# Patient Record
Sex: Male | Born: 1950 | Race: Black or African American | Hispanic: No | Marital: Single | Smoking: Never smoker
Health system: Southern US, Community
[De-identification: ages and names within clinical notes are randomized; demographics above are authoritative.]

## PROBLEM LIST (undated history)

## (undated) DIAGNOSIS — K219 Gastro-esophageal reflux disease without esophagitis: Secondary | ICD-10-CM

## (undated) DIAGNOSIS — I10 Essential (primary) hypertension: Secondary | ICD-10-CM

## (undated) DIAGNOSIS — Z87828 Personal history of other (healed) physical injury and trauma: Secondary | ICD-10-CM

## (undated) DIAGNOSIS — C61 Malignant neoplasm of prostate: Secondary | ICD-10-CM

## (undated) DIAGNOSIS — H9193 Unspecified hearing loss, bilateral: Secondary | ICD-10-CM

## (undated) DIAGNOSIS — Z973 Presence of spectacles and contact lenses: Secondary | ICD-10-CM

## (undated) DIAGNOSIS — Z8669 Personal history of other diseases of the nervous system and sense organs: Secondary | ICD-10-CM

## (undated) DIAGNOSIS — Z794 Long term (current) use of insulin: Secondary | ICD-10-CM

## (undated) DIAGNOSIS — N529 Male erectile dysfunction, unspecified: Secondary | ICD-10-CM

## (undated) DIAGNOSIS — E291 Testicular hypofunction: Secondary | ICD-10-CM

## (undated) DIAGNOSIS — M1990 Unspecified osteoarthritis, unspecified site: Secondary | ICD-10-CM

## (undated) DIAGNOSIS — Z531 Procedure and treatment not carried out because of patient's decision for reasons of belief and group pressure: Secondary | ICD-10-CM

## (undated) DIAGNOSIS — IMO0001 Reserved for inherently not codable concepts without codable children: Secondary | ICD-10-CM

## (undated) DIAGNOSIS — G629 Polyneuropathy, unspecified: Secondary | ICD-10-CM

## (undated) DIAGNOSIS — M25511 Pain in right shoulder: Secondary | ICD-10-CM

## (undated) DIAGNOSIS — N400 Enlarged prostate without lower urinary tract symptoms: Secondary | ICD-10-CM

## (undated) DIAGNOSIS — E119 Type 2 diabetes mellitus without complications: Secondary | ICD-10-CM

## (undated) DIAGNOSIS — E785 Hyperlipidemia, unspecified: Secondary | ICD-10-CM

## (undated) HISTORY — PX: OTHER SURGICAL HISTORY: SHX169

## (undated) HISTORY — DX: Malignant neoplasm of prostate: C61

## (undated) HISTORY — DX: Testicular hypofunction: E29.1

## (undated) HISTORY — PX: PROSTATE BIOPSY: SHX241

## (undated) HISTORY — DX: Essential (primary) hypertension: I10

## (undated) HISTORY — DX: Unspecified osteoarthritis, unspecified site: M19.90

## (undated) HISTORY — PX: TYMPANIC MEMBRANE REPAIR: SHX294

## (undated) HISTORY — DX: Male erectile dysfunction, unspecified: N52.9

---

## 1997-09-10 DIAGNOSIS — E119 Type 2 diabetes mellitus without complications: Secondary | ICD-10-CM

## 1997-09-10 HISTORY — DX: Type 2 diabetes mellitus without complications: E11.9

## 2003-09-11 DIAGNOSIS — Z8679 Personal history of other diseases of the circulatory system: Secondary | ICD-10-CM | POA: Insufficient documentation

## 2005-03-12 ENCOUNTER — Ambulatory Visit: Payer: Self-pay | Admitting: Family Medicine

## 2005-03-12 ENCOUNTER — Encounter (INDEPENDENT_AMBULATORY_CARE_PROVIDER_SITE_OTHER): Payer: Self-pay | Admitting: Internal Medicine

## 2005-03-12 LAB — CONVERTED CEMR LAB
HCT: 33.5 %
MCV: 60.3 fL
Platelets: 234 10*3/uL

## 2005-03-16 ENCOUNTER — Ambulatory Visit: Payer: Self-pay | Admitting: *Deleted

## 2005-04-09 ENCOUNTER — Ambulatory Visit: Payer: Self-pay | Admitting: Family Medicine

## 2005-05-11 ENCOUNTER — Ambulatory Visit: Payer: Self-pay | Admitting: Family Medicine

## 2005-08-22 ENCOUNTER — Emergency Department (HOSPITAL_COMMUNITY): Admission: EM | Admit: 2005-08-22 | Discharge: 2005-08-22 | Payer: Self-pay | Admitting: Emergency Medicine

## 2005-10-15 ENCOUNTER — Ambulatory Visit: Payer: Self-pay | Admitting: Internal Medicine

## 2005-10-16 ENCOUNTER — Encounter (INDEPENDENT_AMBULATORY_CARE_PROVIDER_SITE_OTHER): Payer: Self-pay | Admitting: Internal Medicine

## 2005-10-16 LAB — CONVERTED CEMR LAB: Microalb, Ur: 0.71 mg/dL

## 2005-10-29 ENCOUNTER — Ambulatory Visit: Payer: Self-pay | Admitting: Internal Medicine

## 2006-04-05 ENCOUNTER — Encounter (INDEPENDENT_AMBULATORY_CARE_PROVIDER_SITE_OTHER): Payer: Self-pay | Admitting: Internal Medicine

## 2006-04-05 ENCOUNTER — Ambulatory Visit: Payer: Self-pay | Admitting: Internal Medicine

## 2006-04-05 LAB — CONVERTED CEMR LAB
Cholesterol: 163 mg/dL
Triglycerides: 81 mg/dL

## 2006-08-30 ENCOUNTER — Ambulatory Visit: Payer: Self-pay | Admitting: Internal Medicine

## 2007-01-30 ENCOUNTER — Ambulatory Visit: Payer: Self-pay | Admitting: Internal Medicine

## 2007-04-11 ENCOUNTER — Ambulatory Visit: Payer: Self-pay | Admitting: Internal Medicine

## 2007-04-11 LAB — CONVERTED CEMR LAB
Glucose, Bld: 504 mg/dL
Hgb A1c MFr Bld: 11.2 %

## 2007-04-17 ENCOUNTER — Ambulatory Visit: Payer: Self-pay | Admitting: Internal Medicine

## 2007-04-17 DIAGNOSIS — Z794 Long term (current) use of insulin: Secondary | ICD-10-CM

## 2007-04-17 DIAGNOSIS — M545 Low back pain, unspecified: Secondary | ICD-10-CM | POA: Insufficient documentation

## 2007-04-17 DIAGNOSIS — E782 Mixed hyperlipidemia: Secondary | ICD-10-CM | POA: Insufficient documentation

## 2007-04-17 DIAGNOSIS — F329 Major depressive disorder, single episode, unspecified: Secondary | ICD-10-CM | POA: Insufficient documentation

## 2007-04-17 DIAGNOSIS — I10 Essential (primary) hypertension: Secondary | ICD-10-CM | POA: Insufficient documentation

## 2007-04-17 DIAGNOSIS — E119 Type 2 diabetes mellitus without complications: Secondary | ICD-10-CM | POA: Insufficient documentation

## 2007-04-17 DIAGNOSIS — K5900 Constipation, unspecified: Secondary | ICD-10-CM | POA: Insufficient documentation

## 2007-04-17 DIAGNOSIS — Z9119 Patient's noncompliance with other medical treatment and regimen: Secondary | ICD-10-CM

## 2007-04-17 DIAGNOSIS — E1140 Type 2 diabetes mellitus with diabetic neuropathy, unspecified: Secondary | ICD-10-CM | POA: Insufficient documentation

## 2007-04-17 DIAGNOSIS — D509 Iron deficiency anemia, unspecified: Secondary | ICD-10-CM | POA: Insufficient documentation

## 2007-04-17 LAB — CONVERTED CEMR LAB
ALT: 21 units/L (ref 0–53)
Alkaline Phosphatase: 63 units/L (ref 39–117)
Cholesterol: 174 mg/dL (ref 0–200)
Indirect Bilirubin: 0.4 mg/dL (ref 0.0–0.9)
Total Protein: 7.3 g/dL (ref 6.0–8.3)
Triglycerides: 172 mg/dL — ABNORMAL HIGH (ref <150)

## 2007-04-23 ENCOUNTER — Encounter (INDEPENDENT_AMBULATORY_CARE_PROVIDER_SITE_OTHER): Payer: Self-pay | Admitting: Internal Medicine

## 2007-04-23 LAB — CONVERTED CEMR LAB
AST: 21 units/L (ref 0–37)
Alkaline Phosphatase: 62 units/L (ref 39–117)
BUN: 18 mg/dL (ref 6–23)
Calcium: 9.4 mg/dL (ref 8.4–10.5)
Creatinine, Ser: 1.29 mg/dL (ref 0.40–1.50)
Glucose, Bld: 274 mg/dL — ABNORMAL HIGH (ref 70–99)

## 2007-05-20 ENCOUNTER — Ambulatory Visit: Payer: Self-pay | Admitting: Internal Medicine

## 2007-05-20 DIAGNOSIS — G4700 Insomnia, unspecified: Secondary | ICD-10-CM

## 2007-05-20 DIAGNOSIS — R413 Other amnesia: Secondary | ICD-10-CM

## 2007-08-13 ENCOUNTER — Encounter (INDEPENDENT_AMBULATORY_CARE_PROVIDER_SITE_OTHER): Payer: Self-pay | Admitting: Internal Medicine

## 2007-08-19 ENCOUNTER — Ambulatory Visit: Payer: Self-pay | Admitting: Internal Medicine

## 2007-08-19 ENCOUNTER — Encounter (INDEPENDENT_AMBULATORY_CARE_PROVIDER_SITE_OTHER): Payer: Self-pay | Admitting: Nurse Practitioner

## 2007-08-19 LAB — CONVERTED CEMR LAB: Blood Glucose, Fingerstick: 600

## 2007-08-21 ENCOUNTER — Telehealth (INDEPENDENT_AMBULATORY_CARE_PROVIDER_SITE_OTHER): Payer: Self-pay | Admitting: Internal Medicine

## 2007-08-26 ENCOUNTER — Ambulatory Visit: Payer: Self-pay | Admitting: Internal Medicine

## 2007-08-26 LAB — CONVERTED CEMR LAB: Blood Glucose, Fingerstick: 384

## 2007-09-02 ENCOUNTER — Ambulatory Visit: Payer: Self-pay | Admitting: Internal Medicine

## 2007-10-03 ENCOUNTER — Ambulatory Visit: Payer: Self-pay | Admitting: Internal Medicine

## 2007-10-03 LAB — CONVERTED CEMR LAB: Blood Glucose, Fingerstick: 95

## 2007-10-06 ENCOUNTER — Telehealth (INDEPENDENT_AMBULATORY_CARE_PROVIDER_SITE_OTHER): Payer: Self-pay | Admitting: Internal Medicine

## 2007-12-09 ENCOUNTER — Ambulatory Visit: Payer: Self-pay | Admitting: Internal Medicine

## 2007-12-09 LAB — CONVERTED CEMR LAB
Blood Glucose, Fingerstick: 129
Hgb A1c MFr Bld: 12.7 %

## 2008-01-02 ENCOUNTER — Emergency Department (HOSPITAL_COMMUNITY): Admission: EM | Admit: 2008-01-02 | Discharge: 2008-01-03 | Payer: Self-pay | Admitting: Emergency Medicine

## 2008-02-06 ENCOUNTER — Ambulatory Visit: Payer: Self-pay | Admitting: Internal Medicine

## 2008-02-06 LAB — CONVERTED CEMR LAB
Bilirubin Urine: NEGATIVE
Blood in Urine, dipstick: NEGATIVE
Glucose, Urine, Semiquant: NEGATIVE
Specific Gravity, Urine: 1.01
WBC Urine, dipstick: NEGATIVE
pH: 6

## 2008-02-08 ENCOUNTER — Encounter (INDEPENDENT_AMBULATORY_CARE_PROVIDER_SITE_OTHER): Payer: Self-pay | Admitting: Internal Medicine

## 2008-02-08 LAB — CONVERTED CEMR LAB
ALT: 17 units/L (ref 0–53)
AST: 20 units/L (ref 0–37)
Albumin: 4.5 g/dL (ref 3.5–5.2)
Basophils Absolute: 0 10*3/uL (ref 0.0–0.1)
Basophils Relative: 0 % (ref 0–1)
Calcium: 9.3 mg/dL (ref 8.4–10.5)
Chloride: 104 meq/L (ref 96–112)
MCHC: 30.5 g/dL (ref 30.0–36.0)
Microalb, Ur: 0.5 mg/dL (ref 0.00–1.89)
Neutro Abs: 3.1 10*3/uL (ref 1.7–7.7)
Neutrophils Relative %: 43 % (ref 43–77)
PSA: 3.27 ng/mL (ref 0.10–4.00)
Potassium: 5 meq/L (ref 3.5–5.3)
RBC: 5.61 M/uL (ref 4.22–5.81)
RDW: 18.2 % — ABNORMAL HIGH (ref 11.5–15.5)
Total CHOL/HDL Ratio: 3.5

## 2008-02-09 ENCOUNTER — Encounter (INDEPENDENT_AMBULATORY_CARE_PROVIDER_SITE_OTHER): Payer: Self-pay | Admitting: Internal Medicine

## 2008-02-09 LAB — CONVERTED CEMR LAB
Hgb A2 Quant: 5.6 % — ABNORMAL HIGH (ref 2.2–3.2)
Hgb F Quant: 3.3 % — ABNORMAL HIGH (ref 0.0–2.0)

## 2008-04-02 ENCOUNTER — Ambulatory Visit: Payer: Self-pay | Admitting: Internal Medicine

## 2008-04-03 ENCOUNTER — Encounter (INDEPENDENT_AMBULATORY_CARE_PROVIDER_SITE_OTHER): Payer: Self-pay | Admitting: Internal Medicine

## 2008-04-07 ENCOUNTER — Telehealth (INDEPENDENT_AMBULATORY_CARE_PROVIDER_SITE_OTHER): Payer: Self-pay | Admitting: Internal Medicine

## 2008-04-07 LAB — CONVERTED CEMR LAB
AST: 16 units/L (ref 0–37)
Albumin: 4 g/dL (ref 3.5–5.2)
Alkaline Phosphatase: 78 units/L (ref 39–117)
BUN: 50 mg/dL — ABNORMAL HIGH (ref 6–23)
Basophils Relative: 1 % (ref 0–1)
Creatinine, Ser: 2.08 mg/dL — ABNORMAL HIGH (ref 0.40–1.50)
Eosinophils Absolute: 0.2 10*3/uL (ref 0.0–0.7)
Eosinophils Relative: 4 % (ref 0–5)
MCHC: 31 g/dL (ref 30.0–36.0)
MCV: 60.5 fL — ABNORMAL LOW (ref 78.0–100.0)
Monocytes Relative: 7 % (ref 3–12)
Neutrophils Relative %: 53 % (ref 43–77)
Potassium: 5.5 meq/L — ABNORMAL HIGH (ref 3.5–5.3)
RBC: 5.44 M/uL (ref 4.22–5.81)
Total Bilirubin: 0.4 mg/dL (ref 0.3–1.2)

## 2008-04-14 ENCOUNTER — Ambulatory Visit (HOSPITAL_COMMUNITY): Admission: RE | Admit: 2008-04-14 | Discharge: 2008-04-14 | Payer: Self-pay | Admitting: Internal Medicine

## 2008-04-15 ENCOUNTER — Ambulatory Visit: Payer: Self-pay | Admitting: Internal Medicine

## 2008-04-15 DIAGNOSIS — N189 Chronic kidney disease, unspecified: Secondary | ICD-10-CM | POA: Insufficient documentation

## 2008-04-15 LAB — CONVERTED CEMR LAB
CO2: 19 meq/L (ref 19–32)
Chloride: 109 meq/L (ref 96–112)
Hgb A1c MFr Bld: 13.2 %
Potassium: 4.1 meq/L (ref 3.5–5.3)
Sodium: 141 meq/L (ref 135–145)

## 2008-04-30 ENCOUNTER — Ambulatory Visit: Payer: Self-pay | Admitting: Internal Medicine

## 2008-06-03 ENCOUNTER — Ambulatory Visit: Payer: Self-pay | Admitting: Internal Medicine

## 2008-06-03 DIAGNOSIS — F528 Other sexual dysfunction not due to a substance or known physiological condition: Secondary | ICD-10-CM

## 2008-06-25 ENCOUNTER — Ambulatory Visit: Payer: Self-pay | Admitting: Internal Medicine

## 2008-07-06 ENCOUNTER — Telehealth (INDEPENDENT_AMBULATORY_CARE_PROVIDER_SITE_OTHER): Payer: Self-pay | Admitting: Internal Medicine

## 2008-08-17 ENCOUNTER — Ambulatory Visit: Payer: Self-pay | Admitting: Internal Medicine

## 2008-08-17 LAB — CONVERTED CEMR LAB
Blood Glucose, Fingerstick: 130
Hgb A1c MFr Bld: 7.6 %

## 2008-08-20 DIAGNOSIS — H35049 Retinal micro-aneurysms, unspecified, unspecified eye: Secondary | ICD-10-CM

## 2008-08-20 DIAGNOSIS — E11319 Type 2 diabetes mellitus with unspecified diabetic retinopathy without macular edema: Secondary | ICD-10-CM

## 2008-08-26 ENCOUNTER — Encounter (INDEPENDENT_AMBULATORY_CARE_PROVIDER_SITE_OTHER): Payer: Self-pay | Admitting: Internal Medicine

## 2008-08-27 ENCOUNTER — Encounter (INDEPENDENT_AMBULATORY_CARE_PROVIDER_SITE_OTHER): Payer: Self-pay | Admitting: Internal Medicine

## 2008-09-06 ENCOUNTER — Encounter (INDEPENDENT_AMBULATORY_CARE_PROVIDER_SITE_OTHER): Payer: Self-pay | Admitting: Internal Medicine

## 2008-09-13 ENCOUNTER — Telehealth (INDEPENDENT_AMBULATORY_CARE_PROVIDER_SITE_OTHER): Payer: Self-pay | Admitting: Internal Medicine

## 2008-09-16 ENCOUNTER — Telehealth (INDEPENDENT_AMBULATORY_CARE_PROVIDER_SITE_OTHER): Payer: Self-pay | Admitting: Internal Medicine

## 2008-09-17 ENCOUNTER — Encounter (INDEPENDENT_AMBULATORY_CARE_PROVIDER_SITE_OTHER): Payer: Self-pay | Admitting: Internal Medicine

## 2008-09-24 ENCOUNTER — Encounter (INDEPENDENT_AMBULATORY_CARE_PROVIDER_SITE_OTHER): Payer: Self-pay | Admitting: Internal Medicine

## 2008-10-08 ENCOUNTER — Encounter (INDEPENDENT_AMBULATORY_CARE_PROVIDER_SITE_OTHER): Payer: Self-pay | Admitting: Internal Medicine

## 2008-10-15 ENCOUNTER — Telehealth (INDEPENDENT_AMBULATORY_CARE_PROVIDER_SITE_OTHER): Payer: Self-pay | Admitting: Internal Medicine

## 2008-10-26 ENCOUNTER — Encounter (INDEPENDENT_AMBULATORY_CARE_PROVIDER_SITE_OTHER): Payer: Self-pay | Admitting: *Deleted

## 2008-11-09 ENCOUNTER — Telehealth (INDEPENDENT_AMBULATORY_CARE_PROVIDER_SITE_OTHER): Payer: Self-pay | Admitting: Internal Medicine

## 2008-11-09 ENCOUNTER — Emergency Department (HOSPITAL_COMMUNITY): Admission: EM | Admit: 2008-11-09 | Discharge: 2008-11-09 | Payer: Self-pay | Admitting: Family Medicine

## 2008-11-16 ENCOUNTER — Ambulatory Visit: Payer: Self-pay | Admitting: Internal Medicine

## 2008-11-16 LAB — CONVERTED CEMR LAB
Blood Glucose, Fingerstick: 426
Hgb A1c MFr Bld: 12.5 %

## 2008-11-21 ENCOUNTER — Encounter (INDEPENDENT_AMBULATORY_CARE_PROVIDER_SITE_OTHER): Payer: Self-pay | Admitting: Internal Medicine

## 2008-11-24 ENCOUNTER — Telehealth (INDEPENDENT_AMBULATORY_CARE_PROVIDER_SITE_OTHER): Payer: Self-pay | Admitting: Internal Medicine

## 2008-12-07 ENCOUNTER — Encounter (INDEPENDENT_AMBULATORY_CARE_PROVIDER_SITE_OTHER): Payer: Self-pay | Admitting: *Deleted

## 2008-12-14 ENCOUNTER — Ambulatory Visit: Payer: Self-pay | Admitting: Internal Medicine

## 2008-12-14 DIAGNOSIS — R197 Diarrhea, unspecified: Secondary | ICD-10-CM

## 2008-12-14 LAB — CONVERTED CEMR LAB: Blood Glucose, Fingerstick: 137

## 2008-12-17 ENCOUNTER — Encounter (INDEPENDENT_AMBULATORY_CARE_PROVIDER_SITE_OTHER): Payer: Self-pay | Admitting: Internal Medicine

## 2008-12-20 ENCOUNTER — Encounter (INDEPENDENT_AMBULATORY_CARE_PROVIDER_SITE_OTHER): Payer: Self-pay | Admitting: Internal Medicine

## 2008-12-21 ENCOUNTER — Telehealth (INDEPENDENT_AMBULATORY_CARE_PROVIDER_SITE_OTHER): Payer: Self-pay | Admitting: Internal Medicine

## 2009-01-02 ENCOUNTER — Encounter (INDEPENDENT_AMBULATORY_CARE_PROVIDER_SITE_OTHER): Payer: Self-pay | Admitting: Internal Medicine

## 2009-01-02 ENCOUNTER — Telehealth (INDEPENDENT_AMBULATORY_CARE_PROVIDER_SITE_OTHER): Payer: Self-pay | Admitting: Internal Medicine

## 2009-01-25 ENCOUNTER — Encounter: Admission: RE | Admit: 2009-01-25 | Discharge: 2009-01-25 | Payer: Self-pay | Admitting: Gastroenterology

## 2009-02-10 ENCOUNTER — Encounter (INDEPENDENT_AMBULATORY_CARE_PROVIDER_SITE_OTHER): Payer: Self-pay | Admitting: Internal Medicine

## 2009-02-10 DIAGNOSIS — F4310 Post-traumatic stress disorder, unspecified: Secondary | ICD-10-CM

## 2009-02-15 ENCOUNTER — Ambulatory Visit: Payer: Self-pay | Admitting: Internal Medicine

## 2009-02-20 ENCOUNTER — Emergency Department (HOSPITAL_COMMUNITY): Admission: EM | Admit: 2009-02-20 | Discharge: 2009-02-20 | Payer: Self-pay | Admitting: Emergency Medicine

## 2009-02-21 ENCOUNTER — Ambulatory Visit (HOSPITAL_COMMUNITY): Admission: RE | Admit: 2009-02-21 | Discharge: 2009-02-21 | Payer: Self-pay | Admitting: *Deleted

## 2009-02-21 ENCOUNTER — Encounter (INDEPENDENT_AMBULATORY_CARE_PROVIDER_SITE_OTHER): Payer: Self-pay | Admitting: Gastroenterology

## 2009-02-21 ENCOUNTER — Encounter (INDEPENDENT_AMBULATORY_CARE_PROVIDER_SITE_OTHER): Payer: Self-pay | Admitting: Internal Medicine

## 2009-03-03 ENCOUNTER — Ambulatory Visit: Payer: Self-pay | Admitting: Internal Medicine

## 2009-03-03 LAB — CONVERTED CEMR LAB
AST: 22 units/L (ref 0–37)
Alkaline Phosphatase: 46 units/L (ref 39–117)
BUN: 15 mg/dL (ref 6–23)
Creatinine, Ser: 1.43 mg/dL (ref 0.40–1.50)
Glucose, Bld: 418 mg/dL — ABNORMAL HIGH (ref 70–99)
HDL: 25 mg/dL — ABNORMAL LOW (ref >39)
LDL Cholesterol: 42 mg/dL (ref 0–99)
Potassium: 5.1 meq/L (ref 3.5–5.3)
Total CHOL/HDL Ratio: 4.5
Triglycerides: 226 mg/dL — ABNORMAL HIGH (ref <150)

## 2009-03-10 ENCOUNTER — Encounter (INDEPENDENT_AMBULATORY_CARE_PROVIDER_SITE_OTHER): Payer: Self-pay | Admitting: Internal Medicine

## 2009-03-16 ENCOUNTER — Encounter (INDEPENDENT_AMBULATORY_CARE_PROVIDER_SITE_OTHER): Payer: Self-pay | Admitting: Internal Medicine

## 2009-03-28 ENCOUNTER — Encounter (INDEPENDENT_AMBULATORY_CARE_PROVIDER_SITE_OTHER): Payer: Self-pay | Admitting: Internal Medicine

## 2009-03-28 ENCOUNTER — Telehealth (INDEPENDENT_AMBULATORY_CARE_PROVIDER_SITE_OTHER): Payer: Self-pay | Admitting: Internal Medicine

## 2009-04-02 ENCOUNTER — Emergency Department (HOSPITAL_COMMUNITY): Admission: EM | Admit: 2009-04-02 | Discharge: 2009-04-02 | Payer: Self-pay | Admitting: Emergency Medicine

## 2009-04-14 ENCOUNTER — Encounter (INDEPENDENT_AMBULATORY_CARE_PROVIDER_SITE_OTHER): Payer: Self-pay | Admitting: Internal Medicine

## 2009-07-22 ENCOUNTER — Encounter (INDEPENDENT_AMBULATORY_CARE_PROVIDER_SITE_OTHER): Payer: Self-pay | Admitting: Internal Medicine

## 2009-08-23 ENCOUNTER — Ambulatory Visit: Payer: Self-pay | Admitting: Psychology

## 2009-09-01 ENCOUNTER — Inpatient Hospital Stay (HOSPITAL_COMMUNITY): Admission: EM | Admit: 2009-09-01 | Discharge: 2009-09-02 | Payer: Self-pay | Admitting: Emergency Medicine

## 2009-09-27 ENCOUNTER — Encounter (INDEPENDENT_AMBULATORY_CARE_PROVIDER_SITE_OTHER): Payer: Self-pay | Admitting: Internal Medicine

## 2009-10-18 ENCOUNTER — Ambulatory Visit: Payer: Self-pay | Admitting: Psychology

## 2009-11-03 ENCOUNTER — Encounter: Admission: RE | Admit: 2009-11-03 | Discharge: 2009-11-03 | Payer: Self-pay | Admitting: Internal Medicine

## 2009-12-26 ENCOUNTER — Emergency Department (HOSPITAL_COMMUNITY): Admission: EM | Admit: 2009-12-26 | Discharge: 2009-12-26 | Payer: Self-pay | Admitting: Family Medicine

## 2010-06-10 ENCOUNTER — Emergency Department (HOSPITAL_COMMUNITY): Admission: EM | Admit: 2010-06-10 | Discharge: 2010-06-10 | Payer: Self-pay | Admitting: Family Medicine

## 2010-08-21 ENCOUNTER — Encounter
Admission: RE | Admit: 2010-08-21 | Discharge: 2010-08-21 | Payer: Self-pay | Source: Home / Self Care | Attending: Nephrology | Admitting: Nephrology

## 2010-10-02 ENCOUNTER — Encounter: Payer: Self-pay | Admitting: Internal Medicine

## 2010-10-10 NOTE — Letter (Signed)
Summary: [facility]DR.ORDER/DIABETES TESTING SUPPLIES  [facility]DR.ORDER/DIABETES TESTING SUPPLIES   Imported By: Arta Bruce 11/10/2009 12:48:26  _____________________________________________________________________  External Attachment:    Type:   Image     Comment:   External Document

## 2010-11-23 LAB — POCT I-STAT, CHEM 8
Calcium, Ion: 1.16 mmol/L (ref 1.12–1.32)
Glucose, Bld: 215 mg/dL — ABNORMAL HIGH (ref 70–99)
HCT: 40 % (ref 39.0–52.0)
Hemoglobin: 13.6 g/dL (ref 13.0–17.0)
TCO2: 23 mmol/L (ref 0–100)

## 2010-12-11 LAB — PROTIME-INR
INR: 1.04 (ref 0.00–1.49)
Prothrombin Time: 13.5 seconds (ref 11.6–15.2)

## 2010-12-11 LAB — CBC
MCHC: 31.2 g/dL (ref 30.0–36.0)
MCHC: 31.8 g/dL (ref 30.0–36.0)
MCV: 62.8 fL — ABNORMAL LOW (ref 78.0–100.0)
MCV: 63 fL — ABNORMAL LOW (ref 78.0–100.0)
Platelets: 189 10*3/uL (ref 150–400)
Platelets: 208 10*3/uL (ref 150–400)
RDW: 19.6 % — ABNORMAL HIGH (ref 11.5–15.5)
RDW: 20.2 % — ABNORMAL HIGH (ref 11.5–15.5)

## 2010-12-11 LAB — DIFFERENTIAL
Basophils Absolute: 0 10*3/uL (ref 0.0–0.1)
Eosinophils Relative: 5 % (ref 0–5)
Eosinophils Relative: 5 % (ref 0–5)
Lymphocytes Relative: 38 % (ref 12–46)
Lymphs Abs: 2.6 10*3/uL (ref 0.7–4.0)
Monocytes Relative: 8 % (ref 3–12)
Monocytes Relative: 8 % (ref 3–12)
Neutrophils Relative %: 54 % (ref 43–77)

## 2010-12-11 LAB — URINE CULTURE

## 2010-12-11 LAB — POCT I-STAT, CHEM 8
Calcium, Ion: 1.28 mmol/L (ref 1.12–1.32)
Chloride: 105 mEq/L (ref 96–112)
Glucose, Bld: 505 mg/dL (ref 70–99)
HCT: 37 % — ABNORMAL LOW (ref 39.0–52.0)
Hemoglobin: 12.6 g/dL — ABNORMAL LOW (ref 13.0–17.0)
Potassium: 4.3 mEq/L (ref 3.5–5.1)

## 2010-12-11 LAB — URINALYSIS, ROUTINE W REFLEX MICROSCOPIC
Bilirubin Urine: NEGATIVE
Hgb urine dipstick: NEGATIVE
Nitrite: NEGATIVE
Protein, ur: NEGATIVE mg/dL
Specific Gravity, Urine: 1.027 (ref 1.005–1.030)
Urobilinogen, UA: 1 mg/dL (ref 0.0–1.0)

## 2010-12-11 LAB — COMPREHENSIVE METABOLIC PANEL
AST: 31 U/L (ref 0–37)
AST: 43 U/L — ABNORMAL HIGH (ref 0–37)
Albumin: 3.5 g/dL (ref 3.5–5.2)
CO2: 23 mEq/L (ref 19–32)
CO2: 27 mEq/L (ref 19–32)
Calcium: 8.9 mg/dL (ref 8.4–10.5)
Calcium: 9.3 mg/dL (ref 8.4–10.5)
Creatinine, Ser: 1.14 mg/dL (ref 0.4–1.5)
Creatinine, Ser: 1.39 mg/dL (ref 0.4–1.5)
GFR calc Af Amer: >60 mL/min (ref >60)
GFR calc Af Amer: >60 mL/min (ref >60)
GFR calc non Af Amer: 53 mL/min — ABNORMAL LOW (ref >60)
GFR calc non Af Amer: >60 mL/min (ref >60)
Sodium: 137 mEq/L (ref 135–145)
Total Protein: 6.3 g/dL (ref 6.0–8.3)

## 2010-12-11 LAB — HEMOGLOBIN A1C
Hgb A1c MFr Bld: 11.5 % — ABNORMAL HIGH (ref 4.6–6.1)
Mean Plasma Glucose: 283 mg/dL

## 2010-12-11 LAB — LIPID PANEL
Cholesterol: 146 mg/dL (ref 0–200)
HDL: 21 mg/dL — ABNORMAL LOW (ref >39)
LDL Cholesterol: 58 mg/dL (ref 0–99)
Total CHOL/HDL Ratio: 7 RATIO
VLDL: 67 mg/dL — ABNORMAL HIGH (ref 0–40)

## 2010-12-11 LAB — GLUCOSE, CAPILLARY: Glucose-Capillary: 253 mg/dL — ABNORMAL HIGH (ref 70–99)

## 2010-12-11 LAB — POCT CARDIAC MARKERS
CKMB, poc: 1.6 ng/mL (ref 1.0–8.0)
Myoglobin, poc: 113 ng/mL (ref 12–200)
Troponin i, poc: <0.05 ng/mL (ref 0.00–0.09)

## 2010-12-11 LAB — HEPATITIS PANEL, ACUTE
HCV Ab: NEGATIVE
Hep A IgM: NEGATIVE
Hep B C IgM: NEGATIVE
Hepatitis B Surface Ag: NEGATIVE

## 2010-12-17 LAB — POCT I-STAT, CHEM 8
BUN: 32 mg/dL — ABNORMAL HIGH (ref 6–23)
Calcium, Ion: 1.3 mmol/L (ref 1.12–1.32)
Chloride: 113 mEq/L — ABNORMAL HIGH (ref 96–112)
Glucose, Bld: 224 mg/dL — ABNORMAL HIGH (ref 70–99)
TCO2: 20 mmol/L (ref 0–100)

## 2011-01-23 NOTE — Op Note (Signed)
NAME:  Gordon Young, Gordon Young                 ACCOUNT NO.:  [id number]   MEDICAL RECORD NO.:  [id number]          PATIENT TYPE:  AMB   LOCATION:  ENDO                         FACILITY:  [facility]   PHYSICIAN:  James L. Malon Kindle., M.D.DATE OF BIRTH:  [birth year]   DATE OF PROCEDURE:  02/21/2009  DATE OF DISCHARGE:  02/21/2009                               OPERATIVE REPORT   PROCEDURE:  Esophagogastroduodenoscopy.   INDICATIONS:  Anemia with hemoglobin 9.8, chronic diarrhea.   DESCRIPTION OF PROCEDURE:  Procedure had been explained to the patient  and consent obtained.  In the left lateral decubitus position, the  Pentax operative scope was inserted and advanced.  The distal and  proximal esophagus were endoscopically normal.  No ulceration or  inflammation.  The stomach was entered.  There was a large amount of  solid material including visible noodles.  The patient had been having  chronic diarrhea and had been on a colonoscopy prep for supposedly the  previous 24 hours, and he had claimed that he had taken it as directed.  He should have been on a clear liquid diet the entire day yesterday.  No  gross lesions were seen in the stomach.  The pyloric channel was passed  in the duodenum including bulb and second portion were seen well and was  unremarkable.  Scope was withdrawn back into the stomach and no ulcers  were seen.  The fundus was somewhat obscured by the retained solid food  material.  The scope was withdrawn.  Initial findings of the esophagus  were confirmed.   ASSESSMENT:  Anemia with drop in hemoglobin without obvious bleeding  source found in the upper GI tract.   PLAN:  We will proceed with colonoscopy at this time.           ______________________________  Llana Aliment Malon Kindle., M.D.     Waldron Session  D:  02/21/2009  T:  02/22/2009  Job:  [id number]   cc:   Dr. Julieanne Manson

## 2011-01-23 NOTE — Op Note (Signed)
NAME:  Gordon Young, Gordon Young                 ACCOUNT NO.:  [id number]   MEDICAL RECORD NO.:  [id number]          PATIENT TYPE:  AMB   LOCATION:  ENDO                         FACILITY:  [facility]   PHYSICIAN:  James L. Malon Kindle., M.D.DATE OF BIRTH:  06-12-1951   DATE OF PROCEDURE:  02/21/2009  DATE OF DISCHARGE:  02/21/2009                               OPERATIVE REPORT   PROCEDURE:  Colonoscopy and biopsy.   MEDICATIONS:  The patient received a total of fentanyl of 75 mcg and  Versed 4 mg for both procedures.   SCOPE:  Pediatric Pentax colonoscope.   INDICATIONS:  The patient with diarrhea.   He supposedly had been prepped for colonoscopy with clear liquids all  day yesterday, split-dose prep.  Upper endoscopy just before this shown  solid noodles in his stomach.  The scope was inserted.  The patient had  a large amount of retained liquid foul-smelling stool all the way  through his colon.  We got up into the ascending colon and there was so  much I really could not see through it.  No gross tumors, but certainly  a small tumor or polyp could have been missed due to very poor prep.  Multiple random biopsies were taken and the mucosa appeared grossly  normal.  The scope was withdrawn.  The patient tolerated the procedure  well, and we will see the patient back in the office in 2 months, and  have him continue on Creon.           ______________________________  Llana Aliment Malon Kindle., M.D.     Waldron Session  D:  02/21/2009  T:  02/22/2009  Job:  [id number]   cc:   Marcene Duos, M.D.

## 2011-06-05 LAB — URINALYSIS, ROUTINE W REFLEX MICROSCOPIC
Hgb urine dipstick: NEGATIVE
Ketones, ur: 15 — AB
Protein, ur: 30 — AB
Urobilinogen, UA: 1

## 2011-06-05 LAB — DIFFERENTIAL
Basophils Relative: 0
Eosinophils Absolute: 0.3
Lymphs Abs: 2.8
Monocytes Absolute: 0.5
Neutrophils Relative %: 50

## 2011-06-05 LAB — URINE MICROSCOPIC-ADD ON

## 2011-06-05 LAB — BASIC METABOLIC PANEL
GFR calc non Af Amer: 34 — ABNORMAL LOW
Potassium: 3.9
Sodium: 139

## 2011-06-05 LAB — ABO/RH: ABO/RH(D): A POS

## 2011-06-05 LAB — TYPE AND SCREEN

## 2011-06-05 LAB — CBC
MCHC: 32.5
MCV: 61.4 — ABNORMAL LOW
RDW: 17.5 — ABNORMAL HIGH

## 2011-06-24 ENCOUNTER — Emergency Department (HOSPITAL_COMMUNITY)
Admission: EM | Admit: 2011-06-24 | Discharge: 2011-06-25 | Disposition: A | Discharge status: Home / Self Care | Payer: Medicare Other | Source: Home / Self Care | Attending: Emergency Medicine | Admitting: Emergency Medicine

## 2011-06-24 LAB — BASIC METABOLIC PANEL
BUN: 34 mg/dL — ABNORMAL HIGH (ref 6–23)
Chloride: 93 mEq/L — ABNORMAL LOW (ref 96–112)
GFR calc Af Amer: 40 mL/min — ABNORMAL LOW (ref >90)
GFR calc non Af Amer: 34 mL/min — ABNORMAL LOW (ref >90)
Potassium: 4.6 mEq/L (ref 3.5–5.1)
Sodium: 128 mEq/L — ABNORMAL LOW (ref 135–145)

## 2011-06-24 LAB — URINALYSIS, ROUTINE W REFLEX MICROSCOPIC
Bilirubin Urine: NEGATIVE
Leukocytes, UA: NEGATIVE
Nitrite: NEGATIVE
Specific Gravity, Urine: 1.033 — ABNORMAL HIGH (ref 1.005–1.030)
Urobilinogen, UA: 0.2 mg/dL (ref 0.0–1.0)
pH: 5 (ref 5.0–8.0)

## 2011-06-24 LAB — GLUCOSE, CAPILLARY
Glucose-Capillary: >600 mg/dL (ref 70–99)
Glucose-Capillary: >600 mg/dL (ref 70–99)
Glucose-Capillary: >600 mg/dL (ref 70–99)

## 2011-06-24 LAB — CBC
Platelets: 153 10*3/uL (ref 150–400)
RBC: 6.1 MIL/uL — ABNORMAL HIGH (ref 4.22–5.81)
RDW: 18 % — ABNORMAL HIGH (ref 11.5–15.5)
WBC: 8.7 10*3/uL (ref 4.0–10.5)

## 2011-06-24 LAB — DIFFERENTIAL
Basophils Absolute: 0 10*3/uL (ref 0.0–0.1)
Eosinophils Relative: 1 % (ref 0–5)
Lymphocytes Relative: 22 % (ref 12–46)
Lymphs Abs: 1.9 10*3/uL (ref 0.7–4.0)
Monocytes Relative: 6 % (ref 3–12)
Neutrophils Relative %: 71 % (ref 43–77)

## 2011-06-24 LAB — URINE MICROSCOPIC-ADD ON

## 2011-06-25 ENCOUNTER — Inpatient Hospital Stay (HOSPITAL_COMMUNITY)
Admission: EM | Admit: 2011-06-25 | Discharge: 2011-06-28 | DRG: 638 | Disposition: A | Discharge status: Home / Self Care | Payer: Medicare Other | Source: Ambulatory Visit | Attending: Family Medicine | Admitting: Family Medicine

## 2011-06-25 DIAGNOSIS — N179 Acute kidney failure, unspecified: Secondary | ICD-10-CM | POA: Diagnosis present

## 2011-06-25 DIAGNOSIS — Z9119 Patient's noncompliance with other medical treatment and regimen: Secondary | ICD-10-CM

## 2011-06-25 DIAGNOSIS — G589 Mononeuropathy, unspecified: Secondary | ICD-10-CM | POA: Diagnosis present

## 2011-06-25 DIAGNOSIS — E1165 Type 2 diabetes mellitus with hyperglycemia: Principal | ICD-10-CM | POA: Diagnosis present

## 2011-06-25 DIAGNOSIS — E785 Hyperlipidemia, unspecified: Secondary | ICD-10-CM | POA: Diagnosis present

## 2011-06-25 DIAGNOSIS — Z87820 Personal history of traumatic brain injury: Secondary | ICD-10-CM

## 2011-06-25 DIAGNOSIS — Z79899 Other long term (current) drug therapy: Secondary | ICD-10-CM

## 2011-06-25 DIAGNOSIS — Z8249 Family history of ischemic heart disease and other diseases of the circulatory system: Secondary | ICD-10-CM

## 2011-06-25 DIAGNOSIS — G40909 Epilepsy, unspecified, not intractable, without status epilepticus: Secondary | ICD-10-CM | POA: Diagnosis present

## 2011-06-25 DIAGNOSIS — IMO0002 Reserved for concepts with insufficient information to code with codable children: Principal | ICD-10-CM | POA: Diagnosis present

## 2011-06-25 DIAGNOSIS — E7800 Pure hypercholesterolemia, unspecified: Secondary | ICD-10-CM | POA: Diagnosis present

## 2011-06-25 DIAGNOSIS — E871 Hypo-osmolality and hyponatremia: Secondary | ICD-10-CM | POA: Diagnosis present

## 2011-06-25 DIAGNOSIS — I10 Essential (primary) hypertension: Secondary | ICD-10-CM | POA: Diagnosis present

## 2011-06-25 DIAGNOSIS — Z91199 Patient's noncompliance with other medical treatment and regimen due to unspecified reason: Secondary | ICD-10-CM

## 2011-06-25 DIAGNOSIS — Z820 Family history of epilepsy and other diseases of the nervous system: Secondary | ICD-10-CM

## 2011-06-25 DIAGNOSIS — Z833 Family history of diabetes mellitus: Secondary | ICD-10-CM

## 2011-06-25 LAB — BASIC METABOLIC PANEL
Calcium: 9.3 mg/dL (ref 8.4–10.5)
Creatinine, Ser: 1.36 mg/dL — ABNORMAL HIGH (ref 0.50–1.35)
GFR calc Af Amer: 65 mL/min — ABNORMAL LOW (ref >90)
GFR calc non Af Amer: 56 mL/min — ABNORMAL LOW (ref >90)
Sodium: 140 mEq/L (ref 135–145)

## 2011-06-25 LAB — GLUCOSE, CAPILLARY
Glucose-Capillary: 514 mg/dL — ABNORMAL HIGH (ref 70–99)
Glucose-Capillary: 475 mg/dL — ABNORMAL HIGH (ref 70–99)
Glucose-Capillary: 261 mg/dL — ABNORMAL HIGH (ref 70–99)
Glucose-Capillary: 230 mg/dL — ABNORMAL HIGH (ref 70–99)
Glucose-Capillary: 166 mg/dL — ABNORMAL HIGH (ref 70–99)
Glucose-Capillary: 148 mg/dL — ABNORMAL HIGH (ref 70–99)
Glucose-Capillary: 146 mg/dL — ABNORMAL HIGH (ref 70–99)

## 2011-06-25 LAB — GLUCOSE, RANDOM: Glucose, Bld: 668 mg/dL (ref 70–99)

## 2011-06-25 LAB — CBC
MCHC: 32.5 g/dL (ref 30.0–36.0)
RDW: 17.7 % — ABNORMAL HIGH (ref 11.5–15.5)
WBC: 6.7 10*3/uL (ref 4.0–10.5)

## 2011-06-25 NOTE — H&P (Signed)
NAME:  Gordon Young, Gordon Young                 ACCOUNT NO.:  [id number]  MEDICAL RECORD NO.:  [id number]  LOCATION:  WLED                         FACILITY:  [facility]  PHYSICIAN:  Gery Pray, MD      DATE OF BIRTH:  July 29, 1951  DATE OF ADMISSION:  06/24/2011 DATE OF DISCHARGE:                             HISTORY & PHYSICAL   PRIMARY CARE PHYSICIAN:  Angelena Sole, MD  CODE STATUS:  Full code.  The patient goes to   CHIEF COMPLAINT:  Numbness in the fingers and pain in the legs.  HISTORY OF PRESENT ILLNESS:  This is a 60 year old gentleman with known history of diabetes mellitus.  Patient is extremely a poor historian. He states that he came to the ER because his fingers have been chilled when he was working.  On reviewing the ER notes, the patient states he had numbness.  The patient also had polyuria and polydipsia.  He has no other complaints.  Labs done revealed that the patient had blood sugars of over 800.  The patient does state he is diabetic.  He states he does not take any medication.  He has not been taken medications over 2 months.  He does not adhere to ADA diet.  He states that his machine used to check his sugar do not work; therefore, he cannot take his medications.  He states that he believes he may also need a wheelchair soon as he has been getting increasing pain and numbness in his legs.  He believes his diabetes is getting worse.  He reports no nausea, no vomiting, no fevers, no chills.  REVIEW OF SYSTEMS:  As best able to as noted in HPI.  All other systems, 10 points, negative.  PAST MEDICAL HISTORY: 1. Hypertension. 2. Diabetes. 3. Epilepsy. 4. Dyslipidemia. 5. History of total brain injury. 6. 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  PAST SURGICAL HISTORY:  Includes a surgery related to his total brain injury and ear surgery.  MEDICATIONS:  None.  ALLERGIES: 1. STRAWBERRY. 2. DATURA.  SOCIAL HISTORY:  Negative tobacco,  alcohol, or illicit drugs.  Patient lives alone.  FAMILY HISTORY:  Includes diabetes mellitus, hypertension, thalassemia, and epilepsy.  PHYSICAL EXAMINATION:  VITAL SIGNS:  Blood pressure 139/94, pulse 95, respirations 22, temperature 98.7, saturating 99% on room air. GENERAL:  Alert and oriented male, in no acute distress. EYES:  Pink conjunctivae.  PERRLA. ENT:  Moist oral mucosa.  Trachea midline. NECK:  Supple. LUNGS:  Clear to auscultation bilaterally.  No wheeze appreciated. CV: regular rate and rythm, no murmurs ABDOMEN:  Soft positive bowel sounds, nontender, nondistended.  No organomegaly. NEUROLOGIC:  Cranial nerves II through XII grossly intact.  The patient states sensation bilateral lower extremities are decreased. MUSCULOSKELETAL:  Strength 5/5 in all extremities:  No clubbing, cyanosis, or edema. SKIN:  No rashes.  No subcutaneous crepitations.  LABORATORY STUDIES:  UA is negative with 0 to 2 white blood cells. White blood count 8.5, hemoglobin 11.8, platelets 153.  Sodium 138, potassium 4.6, chloride 93, CO2 of 23, glucose 869, BUN 34, creatinine 2.04, baseline creatinine 1.1.  ASSESSMENT AND PLAN: 1. Nonketotic hyperglycemia. 2. Medical  noncompliance. 3. Hyponatremia.  Patient will be admitted.  We will start the patient     on insulin drip and keep the patient n.p.o., IV fluids, and titrate     the patient over to Lantus when sugars are better controlled.     Diabetic education will be requested. 4. Renal failure.  Unclear if acute or chronic.  Patient will be     hydrated and we will follow creatinine levels in urine output. 5. Hypertension.  We will start the patient on metoprolol.  Patient     does need ACE inhibitor; however, as stated unclear patient's renal     issue is acute or chronic. 6. Dyslipidemia. We will check a lipid panel. 7. History of epilepsy, appears to be stable probably. 8. History of traumatic brain injury.  We will consult PT to  assess     patient's gait.  Patient states that his gait resolved.  We will     evaluate to see if the patient has a fall risk.          ______________________________ Gery Pray, MD     DC/MEDQ  D:  06/25/2011  T:  06/25/2011  Job:  [id number]  Electronically Signed by Gery Pray MD on 06/25/2011 05:22:51 AM

## 2011-06-26 LAB — CBC
HCT: 32.2 % — ABNORMAL LOW (ref 39.0–52.0)
Hemoglobin: 10.5 g/dL — ABNORMAL LOW (ref 13.0–17.0)
MCH: 19.6 pg — ABNORMAL LOW (ref 26.0–34.0)
MCHC: 32.6 g/dL (ref 30.0–36.0)
MCV: 60.1 fL — ABNORMAL LOW (ref 78.0–100.0)
Platelets: 118 10*3/uL — ABNORMAL LOW (ref 150–400)
RBC: 5.36 MIL/uL (ref 4.22–5.81)
RDW: 17.4 % — ABNORMAL HIGH (ref 11.5–15.5)
WBC: 5.6 10*3/uL (ref 4.0–10.5)

## 2011-06-26 LAB — GLUCOSE, RANDOM: Glucose, Bld: 350 mg/dL — ABNORMAL HIGH (ref 70–99)

## 2011-06-26 LAB — GLUCOSE, CAPILLARY
Glucose-Capillary: 342 mg/dL — ABNORMAL HIGH (ref 70–99)
Glucose-Capillary: 300 mg/dL — ABNORMAL HIGH (ref 70–99)
Glucose-Capillary: 410 mg/dL — ABNORMAL HIGH (ref 70–99)
Glucose-Capillary: 177 mg/dL — ABNORMAL HIGH (ref 70–99)
Glucose-Capillary: 276 mg/dL — ABNORMAL HIGH (ref 70–99)

## 2011-06-26 LAB — COMPREHENSIVE METABOLIC PANEL
ALT: 31 U/L (ref 0–53)
Albumin: 3.1 g/dL — ABNORMAL LOW (ref 3.5–5.2)
Alkaline Phosphatase: 84 U/L (ref 39–117)
BUN: 20 mg/dL (ref 6–23)
Chloride: 103 mEq/L (ref 96–112)
GFR calc Af Amer: 78 mL/min — ABNORMAL LOW (ref >90)
Glucose, Bld: 318 mg/dL — ABNORMAL HIGH (ref 70–99)
Potassium: 4.2 mEq/L (ref 3.5–5.1)
Sodium: 135 mEq/L (ref 135–145)
Total Bilirubin: 0.4 mg/dL (ref 0.3–1.2)
Total Protein: 6.4 g/dL (ref 6.0–8.3)

## 2011-06-26 LAB — LIPID PANEL
Cholesterol: 156 mg/dL (ref 0–200)
HDL: 28 mg/dL — ABNORMAL LOW
LDL Cholesterol: 96 mg/dL (ref 0–99)
Total CHOL/HDL Ratio: 5.6 ratio
Triglycerides: 161 mg/dL — ABNORMAL HIGH
VLDL: 32 mg/dL (ref 0–40)

## 2011-06-26 LAB — DIFFERENTIAL
Basophils Absolute: 0 10*3/uL (ref 0.0–0.1)
Eosinophils Absolute: 0.2 10*3/uL (ref 0.0–0.7)
Lymphocytes Relative: 42 % (ref 12–46)
Monocytes Relative: 6 % (ref 3–12)
Neutrophils Relative %: 49 % (ref 43–77)

## 2011-06-27 LAB — BASIC METABOLIC PANEL
BUN: 19 mg/dL (ref 6–23)
Chloride: 105 mEq/L (ref 96–112)
Creatinine, Ser: 1.15 mg/dL (ref 0.50–1.35)
GFR calc Af Amer: 79 mL/min — ABNORMAL LOW (ref >90)
Glucose, Bld: 281 mg/dL — ABNORMAL HIGH (ref 70–99)
Potassium: 4.1 mEq/L (ref 3.5–5.1)

## 2011-06-27 LAB — GLUCOSE, CAPILLARY
Glucose-Capillary: 303 mg/dL — ABNORMAL HIGH (ref 70–99)
Glucose-Capillary: 215 mg/dL — ABNORMAL HIGH (ref 70–99)

## 2011-06-28 LAB — GLUCOSE, CAPILLARY
Glucose-Capillary: 51 mg/dL — ABNORMAL LOW (ref 70–99)
Glucose-Capillary: 121 mg/dL — ABNORMAL HIGH (ref 70–99)
Glucose-Capillary: 144 mg/dL — ABNORMAL HIGH (ref 70–99)

## 2011-07-06 NOTE — Discharge Summary (Signed)
NAMEMarland Kitchen  Gordon Young, Gordon Young                 ACCOUNT NO.:  [id number]  MEDICAL RECORD NO.:  [id number]  LOCATION:  5502                         FACILITY:  [facility]  PHYSICIAN:  Pleas Koch, MD        DATE OF BIRTH:  1951-07-31  DATE OF ADMISSION:  06/25/2011 DATE OF DISCHARGE:  06/28/2011                              DISCHARGE SUMMARY   DISCHARGE DIAGNOSES: 1. Nonketotic hypoglycemic state. 2. Noncompliance with medications secondary to reported poor memory     secondary to traumatic brain injury as a child. 3. Hyponatremia secondary to number 1. 4. Acute kidney injury. 5. Hypertension. 6. Dyslipidemia. 7. History of epilepsy.  DISCHARGE MEDICATIONS:  As follows: 1. Lisinopril 10 mg daily. 2. Gabapentin 300 mg t.i.d. 3. Lopressor 25 mg b.i.d. Please note change in insulin dosages to the following; 38 units at bedtime insulin Lantus and then regular insulin 6 units t.i.d. with meals.  PERTINENT IMAGING STUDIES:  Nil.  Briefly, this is a 60 year old gentleman with history of diabetes mellitus, who came to the ED because of fingers were chilled when he was working.  The patient states he had numbness as well.  The patient also has polyuria and polydipsia.  No other complaints.  His blood sugars were noted to be over 800 and he states has not taken medications since April 2012 and does not adhere to any specific diet.  He states that the machine to check his blood sugars did not work and he did not do any further strips as he ran out and he thinks that he has increasing numbness and pain in his legs.  He also thinks that his diabetes is getting worse.  REVIEW OF SYSTEMS:  A 10-point review of systems is negative.  PHYSICAL EXAMINATION:  VITAL SIGNS:  Blood pressure 139/94, pulse 95, respirations 22, temperature 98.7.  Rest of the physical exam was without issue.  LABORATORY DATA:  Initial labs showed sodium 138, potassium 4.6, chloride 93, CO2 of 23, glucose of 869, BUN and  creatinine of 34 and 2.04, baseline creatinine 1.1.  HOSPITAL COURSE: 1. Nonketotic hypoglycemia.  The patient was admitted to step-down     unit and kept on blue commander and his sugars resolved nicely.  He     was then transferred to tele floor.  It was noted that his blood     sugars were actually in the low-normal range, and the patient was     asymptomatic with a blood sugar of 60 on close to discharge.  His     insulin was scaled up and titrated to his discharge doses as above,     and the patient has been instructed by the nursing as well as by     diabetic educator while in hospital to check his sugars.  I have     instructed him specifically that because of his poor injury     secondary to traumatic brain injury, he needs to have a specific     visual cues when he eats to give himself insulin and he has to take     Lantus.  The patient apparently is followed by [facility], specifically Mr. Bebe Liter, nurse     practitioner who the patient will be followed up within the near     future.  I have instructed the patient to keep an appointment with     them. 2. Medical noncompliance.  See above. 3. Hyponatremia.  This is resolved. 4. Acute kidney injury.  This is resolved as well.  His creatinine     trended down during hospitalization to baseline of 1.15, and he was     started on ACE inhibitor as he does have hypertension. 5. Hypertension.  The patient will start __________ metoprolol and     will need to continue the same. The patient was seen on the day of discharge, was doing well, had no specific issues or complaints.  Temperature 97.8, pulse 80, respirations 21, blood pressures 157-136 over 85-96.  Chest is clinically clear.  No tactile vocal fremitus.  No resonance.  No nausea.  No vomiting.  S1 and S2.  No murmurs, rubs, or gallops.  Abdomen is soft, nontender.  No dysesthesia or other issues.  I spent over 30 minutes in time  coordinating discharge.          ______________________________ Pleas Koch, MD     JS/MEDQ  D:  06/28/2011  T:  06/28/2011  Job:  [id number]  Electronically Signed by Pleas Koch MD on 07/06/2011 02:25:31 PM

## 2012-01-03 ENCOUNTER — Encounter (HOSPITAL_COMMUNITY): Payer: Self-pay | Admitting: *Deleted

## 2012-01-03 ENCOUNTER — Emergency Department (HOSPITAL_COMMUNITY)
Admission: EM | Admit: 2012-01-03 | Discharge: 2012-01-03 | Disposition: A | Discharge status: Home / Self Care | Payer: Medicare Other | Attending: Emergency Medicine | Admitting: Emergency Medicine

## 2012-01-03 DIAGNOSIS — Z9119 Patient's noncompliance with other medical treatment and regimen: Secondary | ICD-10-CM | POA: Insufficient documentation

## 2012-01-03 DIAGNOSIS — N289 Disorder of kidney and ureter, unspecified: Secondary | ICD-10-CM | POA: Insufficient documentation

## 2012-01-03 DIAGNOSIS — E1165 Type 2 diabetes mellitus with hyperglycemia: Secondary | ICD-10-CM

## 2012-01-03 DIAGNOSIS — IMO0001 Reserved for inherently not codable concepts without codable children: Secondary | ICD-10-CM | POA: Insufficient documentation

## 2012-01-03 DIAGNOSIS — I10 Essential (primary) hypertension: Secondary | ICD-10-CM | POA: Insufficient documentation

## 2012-01-03 DIAGNOSIS — Z91199 Patient's noncompliance with other medical treatment and regimen due to unspecified reason: Secondary | ICD-10-CM | POA: Insufficient documentation

## 2012-01-03 DIAGNOSIS — Z794 Long term (current) use of insulin: Secondary | ICD-10-CM | POA: Insufficient documentation

## 2012-01-03 LAB — POCT I-STAT 3, VENOUS BLOOD GAS (G3P V)
Acid-base deficit: 5 mmol/L — ABNORMAL HIGH (ref 0.0–2.0)
Bicarbonate: 22.8 mEq/L (ref 20.0–24.0)
O2 Saturation: 51 %
TCO2: 24 mmol/L (ref 0–100)
pCO2, Ven: 51.5 mmHg — ABNORMAL HIGH (ref 45.0–50.0)
pH, Ven: 7.254 (ref 7.250–7.300)
pO2, Ven: 32 mmHg (ref 30.0–45.0)

## 2012-01-03 LAB — GLUCOSE, CAPILLARY
Glucose-Capillary: >600 mg/dL (ref 70–99)
Glucose-Capillary: 292 mg/dL — ABNORMAL HIGH (ref 70–99)
Glucose-Capillary: 261 mg/dL — ABNORMAL HIGH (ref 70–99)

## 2012-01-03 LAB — CBC
HCT: 35.4 % — ABNORMAL LOW (ref 39.0–52.0)
Hemoglobin: 11.6 g/dL — ABNORMAL LOW (ref 13.0–17.0)
MCH: 19.4 pg — ABNORMAL LOW (ref 26.0–34.0)
MCHC: 32.8 g/dL (ref 30.0–36.0)
MCV: 59.1 fL — ABNORMAL LOW (ref 78.0–100.0)
Platelets: 131 10*3/uL — ABNORMAL LOW (ref 150–400)
RBC: 5.99 MIL/uL — ABNORMAL HIGH (ref 4.22–5.81)
RDW: 16.1 % — ABNORMAL HIGH (ref 11.5–15.5)
WBC: 6.2 10*3/uL (ref 4.0–10.5)

## 2012-01-03 LAB — BASIC METABOLIC PANEL
BUN: 35 mg/dL — ABNORMAL HIGH (ref 6–23)
CO2: 22 mEq/L (ref 19–32)
Calcium: 9.9 mg/dL (ref 8.4–10.5)
Chloride: 94 mEq/L — ABNORMAL LOW (ref 96–112)
Creatinine, Ser: 1.76 mg/dL — ABNORMAL HIGH (ref 0.50–1.35)
GFR calc Af Amer: 47 mL/min — ABNORMAL LOW (ref >90)
GFR calc non Af Amer: 41 mL/min — ABNORMAL LOW (ref >90)
Glucose, Bld: 596 mg/dL (ref 70–99)
Potassium: 5 mEq/L (ref 3.5–5.1)
Sodium: 127 mEq/L — ABNORMAL LOW (ref 135–145)

## 2012-01-03 LAB — URINALYSIS, ROUTINE W REFLEX MICROSCOPIC
Bilirubin Urine: NEGATIVE
Glucose, UA: >1000 mg/dL — AB
Hgb urine dipstick: NEGATIVE
Ketones, ur: NEGATIVE mg/dL
Leukocytes, UA: NEGATIVE
Nitrite: NEGATIVE
Protein, ur: 30 mg/dL — AB
Specific Gravity, Urine: 1.03 (ref 1.005–1.030)
Urobilinogen, UA: 1 mg/dL (ref 0.0–1.0)
pH: 5 (ref 5.0–8.0)

## 2012-01-03 LAB — URINE MICROSCOPIC-ADD ON

## 2012-01-03 MED ORDER — GABAPENTIN 300 MG PO CAPS: 300.0000 mg | ORAL_CAPSULE | Freq: Three times a day (TID) | ORAL | Status: DC

## 2012-01-03 MED ORDER — INSULIN ASPART 100 UNIT/ML ~~LOC~~ SOLN
10.0000 [IU] | Freq: Once | SUBCUTANEOUS | Status: AC
  Administered 2012-01-03: 10 [IU] via INTRAVENOUS
  Filled 2012-01-03: qty 1

## 2012-01-03 MED ORDER — LISINOPRIL 10 MG PO TABS: 10.0000 mg | ORAL_TABLET | Freq: Every day | ORAL | Status: DC

## 2012-01-03 MED ORDER — INSULIN GLARGINE 100 UNIT/ML ~~LOC~~ SOLN: 18.0000 [IU] | Freq: Every day | SUBCUTANEOUS | Status: DC

## 2012-01-03 MED ORDER — INSULIN REGULAR HUMAN 100 UNIT/ML IJ SOLN: 6.0000 [IU] | Freq: Three times a day (TID) | INTRAMUSCULAR | Status: DC

## 2012-01-03 MED ORDER — SODIUM CHLORIDE 0.9 % IV BOLUS (SEPSIS)
1000.0000 mL | Freq: Once | INTRAVENOUS | Status: AC
  Administered 2012-01-03: 1000 mL via INTRAVENOUS

## 2012-01-03 NOTE — ED Notes (Signed)
Pt reports that his doctor called him this morning to tell him that his blood sugar was high yesterday on blood draw and that he needed to go to the hospital.  Denies checking sugar recently, denies taking insulin in over a month.  Pt A/O x 4.

## 2012-01-03 NOTE — Discharge Instructions (Signed)
Blood Sugar Monitoring, Adult GLUCOSE METERS FOR SELF-MONITORING OF BLOOD GLUCOSE  It is important to be able to correctly measure your blood sugar (glucose). You can use a blood glucose monitor (a small battery-operated device) to check your glucose level at any time. This allows you and your caregiver to monitor your diabetes and to determine how well your treatment plan is working. The process of monitoring your blood glucose with a glucose meter is called self-monitoring of blood glucose (SMBG). When people with diabetes control their blood sugar, they have better health. To test for glucose with a typical glucose meter, place the disposable strip in the meter. Then place a small sample of blood on the "test strip." The test strip is coated with chemicals that combine with glucose in blood. The meter measures how much glucose is present. The meter displays the glucose level as a number. Several new models can record and store a number of test results. Some models can connect to personal computers to store test results or print them out.  Newer meters are often easier to use than older models. Some meters allow you to get blood from places other than your fingertip. Some new models have automatic timing, error codes, signals, or barcode readers to help with proper adjustment (calibration). Some meters have a large display screen or spoken instructions for people with visual impairments.  INSTRUCTIONS FOR USING GLUCOSE METERS  Wash your hands with soap and warm water, or clean the area with alcohol. Dry your hands completely.   Prick the side of your fingertip with a lancet (a sharp-pointed tool used by hand).   Hold the hand down and gently milk the finger until a small drop of blood appears. Catch the blood with the test strip.   Follow the instructions for inserting the test strip and using the SMBG meter. Most meters require the meter to be turned on and the test strip to be inserted before  applying the blood sample.   Record the test result.   Read the instructions carefully for both the meter and the test strips that go with it. Meter instructions are found in the user manual. Keep this manual to help you solve any problems that may arise. Many meters use "error codes" when there is a problem with the meter, the test strip, or the blood sample on the strip. You will need the manual to understand these error codes and fix the problem.   New devices are available such as laser lancets and meters that can test blood taken from "alternative sites" of the body, other than fingertips. However, you should use standard fingertip testing if your glucose changes rapidly. Also, use standard testing if:   You have eaten, exercised, or taken insulin in the past 2 hours.   You think your glucose is low.   You tend to not feel symptoms of low blood glucose (hypoglycemia).   You are ill or under stress.   Clean the meter as directed by the manufacturer.   Test the meter for accuracy as directed by the manufacturer.   Take your meter with you to your caregiver's office. This way, you can test your glucose in front of your caregiver to make sure you are using the meter correctly. Your caregiver can also take a sample of blood to test using a routine lab method. If values on the glucose meter are close to the lab results, you and your caregiver will see that your meter is working well  and you are using good technique. Your caregiver will advise you about what to do if the results do not match.  FREQUENCY OF TESTING  Your caregiver will tell you how often you should check your blood glucose. This will depend on your type of diabetes, your current level of diabetes control, and your types of medicines. The following are general guidelines, but your care plan may be different. Record all your readings and the time of day you took them for review with your caregiver.   Diabetes type 1.   When you  are using insulin with good diabetic control (either multiple daily injections or via a pump), you should check your glucose 4 times a day.   If your diabetes is not well controlled, you may need to monitor more frequently, including before meals and 2 hours after meals, at bedtime, and occasionally between 2 a.m. and 3 a.m.   You should always check your glucose before a dose of insulin or before changing the rate on your insulin pump.   Diabetes type 2.   Guidelines for SMBG in diabetes type 2 are not as well defined.   If you are on insulin, follow the guidelines above.   If you are on medicines, but not insulin, and your glucose is not well controlled, you should test at least twice daily.   If you are not on insulin, and your diabetes is controlled with medicines or diet alone, you should test at least once daily, usually before breakfast.   A weekly profile will help your caregiver advise you on your care plan. The week before your visit, check your glucose before a meal and 2 hours after a meal at least daily. You may want to test before and after a different meal each day so you and your caregiver can tell how well controlled your blood sugars are throughout the course of a 24 hour period.   Gestational diabetes (diabetes during pregnancy).   Frequent testing is often necessary. Accurate timing is important.   If you are not on insulin, check your glucose 4 times a day. Check it before breakfast and 1 hour after the start of each meal.   If you are on insulin, check your glucose 6 times a day. Check it before each meal and 1 hour after the first bite of each meal.   General guidelines.   More frequent testing is required at the start of insulin treatment. Your caregiver will instruct you.   Test your glucose any time you suspect you have low blood sugar (hypoglycemia).   You should test more often when you change medicines, when you have unusual stress or illness, or in other  unusual circumstances.  OTHER THINGS TO KNOW ABOUT GLUCOSE METERS  Measurement Range. Most glucose meters are able to read glucose levels over a broad range of values from as low as 0 to as high as 600 mg/dL. If you get an extremely high or low reading from your meter, you should first confirm it with another reading. Report very high or very low readings to your caregiver.   Whole Blood Glucose versus Plasma Glucose. Some older home glucose meters measure glucose in your whole blood. In a lab or when using some newer home glucose meters, the glucose is measured in your plasma (one component of blood). The difference can be important. It is important for you and your caregiver to know whether your meter gives its results as "whole blood equivalent" or "plasma  equivalent."   Display of High and Low Glucose Values. Part of learning how to operate a meter is understanding what the meter results mean. Know how high and low glucose concentrations are displayed on your meter.   Factors that Affect Glucose Meter Performance. The accuracy of your test results depends on many factors and varies depending on the brand and type of meter. These factors include:   Low red blood cell count (anemia).   Substances in your blood (such as uric acid, vitamin C, and others).   Environmental factors (temperature, humidity, altitude).   Name-brand versus generic test strips.   Calibration. Make sure your meter is set up properly. It is a good idea to do a calibration test with a control solution recommended by the manufacturer of your meter whenever you begin using a fresh bottle of test strips. This will help verify the accuracy of your meter.   Improperly stored, expired, or defective test strips. Keep your strips in a dry place with the lid on.   Soiled meter.   Inadequate blood sample.  NEW TECHNOLOGIES FOR GLUCOSE TESTING Alternative site testing Some glucose meters allow testing blood from alternative  sites. These include the:  Upper arm.   Forearm.   Base of the thumb.   Thigh.  Sampling blood from alternative sites may be desirable. However, it may have some limitations. Blood in the fingertips show changes in glucose levels more quickly than blood in other parts of the body. This means that alternative site test results may be different from fingertip test results, not because of the meter's ability to test accurately, but because the actual glucose concentration can be different.  Continuous Glucose Monitoring Devices to measure your blood glucose continuously are available, and others are in development. These methods can be more expensive than self-monitoring with a glucose meter. However, it is uncertain how effective and reliable these devices are. Your caregiver will advise you if this approach makes sense for you. IF BLOOD SUGARS ARE CONTROLLED, PEOPLE WITH DIABETES REMAIN HEALTHIER.  SMBG is an important part of the treatment plan of patients with diabetes mellitus. Below are reasons for using SMBG:   It confirms that your glucose is at a specific, healthy level.   It detects hypoglycemia and severe hyperglycemia.   It allows you and your caregiver to make adjustments in response to changes in lifestyle for individuals requiring medicine.   It determines the need for starting insulin therapy in temporary diabetes that happens during pregnancy (gestational diabetes).  Document Released: 08/30/2003 Document Revised: 08/16/2011 Document Reviewed: 12/21/2010 [facility] Patient Information 2012 [place], [place].Diabetes and Exercise Regular exercise is important and can help:   Control blood glucose (sugar).   Decrease blood pressure. 1.   Control blood lipids (cholesterol, triglycerides).   Improve overall health.  BENEFITS FROM EXERCISE  Improved fitness.   Improved flexibility.   Improved endurance.   Increased bone density.   Weight control.   Increased muscle  strength.   Decreased body fat.   Improvement of the body's use of insulin, a hormone.   Increased insulin sensitivity.   Reduction of insulin needs.   Reduced stress and tension.   Helps you feel better.  People with diabetes who add exercise to their lifestyle gain additional benefits, including:  Weight loss.   Reduced appetite.   Improvement of the body's use of blood glucose.   Decreased risk factors for heart disease:   Lowering of cholesterol and triglycerides.   Raising the  level of good cholesterol (high-density lipoproteins, HDL).   Lowering blood sugar.   Decreased blood pressure.  TYPE 1 DIABETES AND EXERCISE  Exercise will usually lower your blood glucose.   If blood glucose is greater than 240 mg/dl, check urine ketones. If ketones are present, do not exercise.   Location of the insulin injection sites may need to be adjusted with exercise. Avoid injecting insulin into areas of the body that will be exercised. For example, avoid injecting insulin into:   The arms when playing tennis.   The legs when jogging. For more information, discuss this with your caregiver.   Keep a record of:   Food intake.   Type and amount of exercise.   Expected peak times of insulin action.   Blood glucose levels.  Do this before, during, and after exercise. Review your records with your caregiver. This will help you to develop guidelines for adjusting food intake and insulin amounts.  TYPE 2 DIABETES AND EXERCISE  Regular physical activity can help control blood glucose.   Exercise is important because it may:   Increase the body's sensitivity to insulin.   Improve blood glucose control.   Exercise reduces the risk of heart disease. It decreases serum cholesterol and triglycerides. It also lowers blood pressure.   Those who take insulin or oral hypoglycemic agents should watch for signs of hypoglycemia. These signs include dizziness, shaking, sweating, chills,  and confusion.   Body water is lost during exercise. It must be replaced. This will help to avoid loss of body fluids (dehydration) or heat stroke.  Be sure to talk to your caregiver before starting an exercise program to make sure it is safe for you. Remember, any activity is better than none.  Document Released: 11/17/2003 Document Revised: 08/16/2011 Document Reviewed: 03/03/2009 [facility] Patient Information 2012 [place].Diabetes and Foot Care Diabetes may cause you to have a poor blood supply (circulation) to your legs and feet. Because of this, the skin may be thinner, break easier, and heal more slowly. You also may have nerve damage in your legs and feet causing decreased feeling. You may not notice minor injuries to your feet that could lead to serious problems or infections. Taking care of your feet is one of the most important things you can do for yourself.  HOME CARE INSTRUCTIONS  Do not go barefoot. Bare feet are easily injured.   Check your feet daily for blisters, cuts, and redness.   Wash your feet with warm water (not hot) and mild soap. Pat your feet and between your toes until completely dry.   Apply a moisturizing lotion that does not contain alcohol or petroleum jelly to the dry skin on your feet and to dry brittle toenails. Do not put it between your toes.   Trim your toenails straight across. Do not dig under them or around the cuticle.   Do not cut corns or calluses, or try to remove them with medicine.   Wear clean cotton socks or stockings every day. Make sure they are not too tight. Do not wear knee high stockings since they may decrease blood flow to your legs.   Wear leather shoes that fit properly and have enough cushioning. To break in new shoes, wear them just a few hours a day to avoid injuring your feet.   Wear shoes at all times, even in the house.   Do not cross your legs. This may decrease the blood flow to your feet.   If  you find a minor  scrape, cut, or break in the skin on your feet, keep it and the skin around it clean and dry. These areas may be cleansed with mild soap and water. Do not use peroxide, alcohol, iodine or Merthiolate.   When you remove an adhesive bandage, be sure not to harm the skin around it.   If you have a wound, look at it several times a day to make sure it is healing.   Do not use heating pads or hot water bottles. Burns can occur. If you have lost feeling in your feet or legs, you may not know it is happening until it is too late.   Report any cuts, sores or bruises to your caregiver. Do not wait!  SEEK MEDICAL CARE IF:   You have an injury that is not healing or you notice redness, numbness, burning, or tingling.   Your feet always feel cold.   You have pain or cramps in your legs and feet.  SEEK IMMEDIATE MEDICAL CARE IF:   There is increasing redness, swelling, or increasing pain in the wound.   There is a red line that goes up your leg.   Pus is coming from a wound.   You develop an unexplained oral temperature above 102 F (38.9 C), or as your caregiver suggests.   You notice a bad smell coming from an ulcer or wound.  MAKE SURE YOU:   Understand these instructions.   Will watch your condition.   Will get help right away if you are not doing well or get worse.  Document Released: 08/24/2000 Document Revised: 08/16/2011 Document Reviewed: 03/02/2009 [facility] Patient Information 2012 [place], [place].Diabetes, Frequently Asked Questions WHAT IS DIABETES? Most of the food we eat is turned into glucose (sugar). Our bodies use it for energy. The pancreas makes a hormone called insulin. It helps glucose get into the cells of our bodies. When you have diabetes, your body either does not make enough insulin or cannot use its own insulin as well as it should. This causes sugars to build up in your blood. WHAT ARE THE SYMPTOMS OF DIABETES?  Frequent urination.   Excessive thirst.    Unexplained weight loss.   Extreme hunger.   Blurred vision.   Tingling or numbness in hands or feet.   Feeling very tired much of the time.   Dry, itchy skin.   Sores that are slow to heal.   Yeast infections.  WHAT ARE THE TYPES OF DIABETES? Type 1 Diabetes   About 10% of affected people have this type.   Usually occurs before the age of 64.   Usually occurs in thin to normal weight people.  Type 2 Diabetes  About 90% of affected people have this type.   Usually occurs after the age of 84.   Usually occurs in overweight people.   More likely to have:   A family history of diabetes.   A history of diabetes during pregnancy (gestational diabetes).   High blood pressure.   High cholesterol and triglycerides.  Gestational Diabetes  Occurs in about 4% of pregnancies.   Usually goes away after the baby is born.   More likely to occur in women with:   Family history of diabetes.   Previous gestational diabetes.   Obese.   Over 57 years old.  WHAT IS PRE-DIABETES? Pre-diabetes means your blood glucose is higher than normal, but lower than the diabetes range. It also means you are at risk  of getting type 2 diabetes and heart disease. If you are told you have pre-diabetes, have your blood glucose checked again in 1 to 2 years. WHAT IS THE TREATMENT FOR DIABETES? Treatment is aimed at keeping blood glucose near normal levels at all times. Learning how to manage this yourself is important in treating diabetes. Depending on the type of diabetes you have, your treatment will include one or more of the following:  Monitoring your blood glucose.   Meal planning.   Exercise.   Oral medicine (pills) or insulin.  CAN DIABETES BE PREVENTED? With type 1 diabetes, prevention is more difficult, because the triggers that cause it are not yet known. With type 2 diabetes, prevention is more likely, with lifestyle changes:  Maintain a healthy weight.   Eat healthy.    Exercise.  IS THERE A CURE FOR DIABETES? No, there is no cure for diabetes. There is a lot of research going on that is looking for a cure, and progress is being made. Diabetes can be treated and controlled. People with diabetes can manage their diabetes and lead normal, active lives. SHOULD I BE TESTED FOR DIABETES? If you are at least 59 years old, you should be tested for diabetes. You should be tested again every 3 years. If you are 45 or older and overweight, you may want to get tested more often. If you are younger than 45, overweight, and have one or more of the following risk factors, you should be tested:  Family history of diabetes.   Inactive lifestyle.   High blood pressure.  WHAT ARE SOME OTHER SOURCES FOR INFORMATION ON DIABETES? The following organizations may help in your search for more information on diabetes: National Diabetes Education Program (NDEP) Internet: [employer] American Diabetes Association Internet: http://www.diabetes.org  Juvenile Diabetes Foundation International Internet: [employer] Document Released: 08/30/2003 Document Revised: 08/16/2011 Document Reviewed: 06/24/2009 [facility] Patient Information 2012 [place], [place].Diabetes and Small Vessel Disease Small vessel disease (microvascular disease) includes nephropathy, retinopathy, and neuropathy. People with diabetes are at risk for these problems, but keeping blood glucose (sugar) controlled is helpful in preventing problems. DIABETIC KIDNEY PROBLEMS (DIABETIC NEPHROPATHY)  Diabetic nephropathy occurs in many patients with diabetes.   Damage to the small vessels in the kidneys is the leading cause of end-stage renal disease (ESRD).   Protein in the urine (albuminuria) in the range of 30 to 300 mg/24 h (microalbuminuria) is a sign of the earliest stage of diabetic nephropathy.   Good blood glucose (sugar) and blood pressure control significantly reduce the progression  of nephropathy.  DIABETIC EYE PROBLEMS (DIABETIC RETINOPATHY)  Diabetic retinopathy is the most common cause of new cases of blindness in adults. It is related to the number of years you have had diabetes.   Common risk factors include high blood sugar (hyperglycemia), high blood pressure (hypertension), and poorly controlled blood lipids such as high blood cholesterol (hypercholesterolemia).  DIABETIC NERVE PROBLEMS (DIABETIC NEUROPATHY) Diabetic neuropathy is the most common, long-term complication of diabetes. It is responsible for more than half of leg amputations not due to accidents. The main risk for developing diabetic neuropathy seems to be uncontrolled blood sugars. Hyperglycemia damages the nerve fibers causing sensation (feeling) problems. The closer you can keep the following guidelines, the better chance you will have avoiding problems from small vessel disease.  Working toward near normal blood glucose or as normal as possible. You will need to keep your blood glucose and A1c at the target range prescribed by your caregiver.  Keep your blood pressure less than 130/80.   Keep your low-density lipoprotein (LDL) cholesterol (one of the fats in your blood) at less than 100 mg/dL. An LDL less than 70 mg/dL may be recommended for high risk patients.  You cannot change your family history, but it is important to change the risk factors that you can. Risk factors you can control include:  Controlling high blood pressure.   Stopping smoking.   Using alcohol only in moderation. Generally, this means about one drink per day for women and two drinks per day for men.   Controlling your blood lipids (cholesterol and triglycerides).   Treating heart problems, if these are contributing to risk.  SEEK MEDICAL CARE IF:   You are having problems keeping your blood glucose in goal range.   You notice a change in your vision or new problems with your vision.   You have wound or sore that  does not heal.   Your blood pressure is above the target range.  Document Released: 08/30/2003 Document Revised: 08/16/2011 Document Reviewed: 02/04/2009 [facility] Patient Information 2012 [place], [place].How to Avoid Diabetes Problems You can do a lot to prevent or slow down diabetes problems. Following your diabetes plan and taking care of yourself can reduce your risk of serious or life-threatening complications. Below, you will find certain things you can do to prevent diabetes problems. MANAGE YOUR DIABETES Follow your caregiver's, nurse educator's, and dietician's instructions for managing your diabetes. They will teach you the basics of diabetes care. They can help answer questions you may have. Learn about diabetes and make healthy choices regarding eating and physical activity. Monitor your blood glucose level regularly. Your caregiver will help you decide how often to check your blood glucose level depending on your treatment goals and how well you are meeting them.  DO NOT SMOKE Smoking and diabetes are a dangerous combination. Smoking raises your risk for diabetes problems. If you quit smoking, you will lower your risk for heart attack, stroke, nerve disease, and kidney disease. Your cholesterol and your blood pressure levels may improve. Your blood circulation will also improve. If you smoke, ask your caregiver for help in quitting. KEEP YOUR BLOOD PRESSURE UNDER CONTROL Keeping your blood pressure under control will help prevent damage to your eyes, kidneys, heart, and blood vessels. Blood pressure consists of 2 numbers. The top number should be below 130, and the bottom number should be below 80 (130/80). Keep your blood pressure as close to these numbers as you can. If you already have kidney disease, you may want even lower blood pressure to protect your kidneys. Talk to your caregiver to make sure that your blood pressure goal is right for your needs. Meal planning, medicines, and  exercise can help you reach your blood pressure target. Have your blood pressure checked at every visit with your caregiver. KEEP YOUR CHOLESTEROL UNDER CONTROL Normal cholesterol levels will help prevent heart disease and stroke. These are the biggest health problems for people with diabetes. Keeping cholesterol levels under control can also help with blood flow. Have your cholesterol level checked at least once a year. Meal planning, exercise, and medicines can help you reach your cholesterol targets. SCHEDULE AND KEEP YOUR ANNUAL PHYSICAL EXAMS AND EYE EXAMS Your caregiver will tell you how often he or she wants to see you depending on your plan of treatment. It is important that you keep these appointments so that possible problems can be identified early and complications can be avoided or  treated.  Every visit with your caregiver should include your weight, blood pressure, and an evaluation of your blood glucose control.   Your hemoglobin A1c should be checked:   At least twice a year if you are at your goal.   Every 3 months if there are changes in treatment.   If you are not meeting your goals.   Your blood lipids should be checked yearly. You should also be checked yearly to see if you have protein in your urine (microalbumin).   Schedule a dilated eye exam if you have type 1 diabetes within 5 years of your diagnosis and then yearly. Schedule a dilated eye exam if you have type 2 diabetes at diagnosis and then yearly. All exams thereafter can be extended to every 2 to 3 years if one or more exams have been normal.  KEEP YOUR VACCINES CURRENT The flu vaccine is recommended yearly. The formula for the vaccine changes every year and needs to be updated for the best protection against current viruses. In addition, you should get a vaccination against pneumonia at least once in your life. However, there are some instances where another vaccine is recommended. Check with your caregiver. TAKE  CARE OF YOUR FEET  Diabetes may cause you to have a poor blood supply (circulation) to your legs and feet. Because of this, the skin may be thinner, break easier, and heal more slowly. You also may have nerve damage in your legs and feet causing decreased feeling. You may not notice minor injuries to your feet that could lead to serious problems or infections. Taking care of your feet is very important. Visual foot exams are performed at every routine medical visit. The exams check for cuts, injuries, or other problems with the feet. A comprehensive foot exam should be done yearly. This includes visual inspection as well as assessing foot pulses and testing for loss of sensation. You should also do the following:  Inspect your feet daily for cuts, calluses, blisters, ingrown toenails, and signs of infection, such as redness, swelling, or pus.   Wash and dry your feet thoroughly, especially between the toes.   Avoid soaking your feet regularly in hot water baths.   Moisturize dry skin with lotion, avoiding areas between your toes.   Cut toenails straight across and file the edges.   Avoid shoes that do not fit well or have areas that irritate your skin.   Avoid going barefooted or wearing only socks. Your feet need protection.  TAKE CARE OF YOUR TEETH People with poorly controlled diabetes are more likely to have gum (periodontal) disease. These infections make diabetes harder to control. Periodontal diseases, if left untreated, can lead to tooth loss. Brush your teeth twice a day, floss, and see your dentist for checkups and cleaning every 6 months, or 2 times a year. ASK YOUR CAREGIVER ABOUT TAKING ASPIRIN Taking aspirin daily is recommended to help prevent cardiovascular disease in people with and without diabetes. Ask your caregiver if this would benefit you and what dose he or she would recommend. DRINK RESPONSIBLY Moderate amounts of alcohol (less than 1 drink per day for adult women and  less than 2 drinks per day for adult men) have a minimal effect on blood glucose if ingested with food. It is important to eat food with alcohol to avoid hypoglycemia. People should avoid alcohol if they have a history of alcohol abuse or dependence, if they are pregnant, and if they have liver disease, pancreatitis, advanced neuropathy,  or severe hypertriglyceridemia. LESSEN STRESS Living with diabetes can be stressful. When you are under stress, your blood glucose may be affected in two ways:  Stress hormones may cause your blood glucose to rise.   You may be distracted from taking good care of yourself.  It is a good idea to be aware of your stress level and make changes that are necessary to help you better manage challenging situations. Support groups, planned relaxation, a hobby you enjoy, meditation, healthy relationships, and exercise all work to lower your stress level. If your efforts do not seem to be helping, get help from your caregiver or a trained mental health professional. Document Released: 05/15/2011 Document Revised: 08/16/2011 Document Reviewed: 05/15/2011 [facility] Patient Information 2012 [place], [place].Daily Diabetes Record Week of _____________________________ Date: _________  Seward Carol, BG/Medications: ________________ / __________________________________________________________   LUNCH, BG/Medications: ____________________ / __________________________________________________________   Laural Golden, BG/Medications: ___________________ / __________________________________________________________   BEDTIME, BG/Medications: __________________ / __________________________________________________________  Date: _________  Seward Carol, BG/Medications: ________________ / __________________________________________________________   LUNCH, BG/Medications: ____________________ / __________________________________________________________   Laural Golden, BG/Medications: ___________________  / __________________________________________________________   BEDTIME, BG/Medications: __________________ / __________________________________________________________  Date: _________  Seward Carol, BG/Medications: ________________ / __________________________________________________________   LUNCH, BG/Medications: ____________________ / __________________________________________________________   Laural Golden, BG/Medications: ___________________ / __________________________________________________________   BEDTIME, BG/Medications: __________________ / __________________________________________________________  Date: _________  Seward Carol, BG/Medications: ________________ / __________________________________________________________   LUNCH, BG/Medications: ____________________ / __________________________________________________________   Laural Golden, BG/Medications: ___________________ / __________________________________________________________   BEDTIME, BG/Medications: __________________ / __________________________________________________________  Date: _________  Seward Carol, BG/Medications: ________________ / __________________________________________________________   LUNCH, BG/Medications: ____________________ / __________________________________________________________   Laural Golden, BG/Medications: ___________________ / __________________________________________________________   BEDTIME, BG/Medications: __________________ / __________________________________________________________  Date: _________  Seward Carol, BG/Medications: ________________ / __________________________________________________________   LUNCH, BG/Medications: ____________________ / __________________________________________________________   Laural Golden, BG/Medications: ___________________ / __________________________________________________________   BEDTIME, BG/Medications: __________________ /  __________________________________________________________  Date: _________  Seward Carol, BG/Medications: ________________ / __________________________________________________________   LUNCH, BG/Medications: ____________________ / __________________________________________________________   Laural Golden, BG/Medications: ___________________ / __________________________________________________________   BEDTIME, BG/Medications: __________________ / __________________________________________________________  Notes: __________________________________________________________________________________________________ Document Released: 07/31/2004 Document Revised: 08/16/2011 Document Reviewed: 05/25/2009 [employer] Patient Information 2012 [employer].High Blood Sugar High blood sugar (hyperglycemia) means that the level of sugar in your blood is higher than it should be. Signs of high blood sugar include:  Feeling thirsty.   Frequent peeing (urinating).   Feeling tired or sleepy.   Dry mouth.   Vision changes.   Feeling weak.   Feeling hungry but losing weight.   Numbness and tingling in your hands or feet.   Headache.  When you ignore these signs, your blood sugar may keep going up. These problems may get worse, and other problems may begin. HOME CARE  Check your blood sugars as told by your doctor. Write down the numbers with the date and time.   Take the right amount of insulin or diabetes pills at the right time. Write down the dose with date and time.   Refill your insulin or diabetes pills before running out.   Watch what you eat. Follow your meal plan.   Drink liquids without sugar, such as water. Check with your doctor if you have kidney or heart disease.   Follow your doctor's orders for exercise. Exercise at the same time of day.   Keep your doctor's appointments.  GET HELP RIGHT AWAY IF:   You have trouble thinking or are confused.   You have fast breathing with  fruity smelling breath.   You pass out (faint).   You have  2 to 3 days of high blood sugars and you do not know why.   You have chest pain.   You are feeling sick to your stomach (nauseous) or throwing up (vomiting).   You have sudden vision changes.  MAKE SURE YOU:   Understand these instructions.   Will watch your condition.   Will get help right away if you are not doing well or get worse.  Document Released: 06/24/2009 Document Revised: 08/16/2011 Document Reviewed: 06/24/2009 [facility] Patient Information 2012 [place], [place].Insulin Storage and Care All insulin pens and bottles (vials) have expiration dates. Refrigerated, unopened insulin pens and vials are good until the expiration date. Do not use insulin after this date. Once insulin is opened, it should be thrown away at the appropriate time. The table below will help guide you. INSULINS  Humalog (lispro)   Vials: 28 days   Pens: 28 days   NovoLog(aspart)   Vials: 28 days   Pens: 28 days   Apidra( glulisine)   Vials: 28 days*   Pens: 28 days*   Humulin R (regular)   Vials: 28 days   Pens: 28 days   Humulin R (regular) U-500   Vials: 28 days   Pens: NA   Novolin R (regular)   Vials: 42 days*   Pens: 28 days   Humulin N (NPH)   Vials: 28 days   Pens: 14 days   Novolin N (NPH)   Vials: 42 days*   Pens: 14 days   Humulin 70/30   Vials: 28 days   Pens: 10 days   Humulin 50/50   Vials: 28 days   Pens: 10 days   Novolin 70/30   Vials: 42 days*   Pens: 10 days   Humalog Mix 75/25   Vials: 28 days   Pens: 10 days   Humalog 50/50   Vials: 28 days   Pens: 10 days   NovoLog Mix 70/30   Vials: 28 days   Pens: 14 days   Lantus (glargine)   Vials: 28 days   Pens: 28 days   Levemir (detemir)   Vials: 42 days*   Pens: 42 days*  CARING FOR YOUR INSULIN  If insulin is kept at room temperature, it must be less than 86 F (30 C). Those marked  * must be stored at less than 77 F (25 C).   If insulin is kept in the refrigerator, it must be between 36-46 F (2.22-7.77 C).   Do not freeze insulin.   Keep insulin away from direct heat or sunlight.   Throw away the insulin if it is discolored, thick or has clumps, or suspended white particles.   Be sure to mix "cloudy" insulins by rolling gently. Pens can be "rocked" from end to end.   Opened insulin pens should be kept at room temperature.   Always have extra insulin on hand.  Every effort is made to assure accuracy. This information is meant to be used as a tool to guide you in the proper care and storage of insulin. Document Released: 06/24/2009 Document Revised: 08/16/2011 Document Reviewed: 06/24/2009 [facility] Patient Information 2012 [place], [place].Insulin Treatment in Diabetes Diabetes is a lasting (chronic) disease. It occurs when the body does not properly use the sugar (glucose) that is released from food. Glucose levels are controlled by a hormone called insulin, which is made by your pancreas. Depending on the type of diabetes you have, either:  The pancreas does not make any insulin (type 1 diabetes).  The pancreas makes too little insulin, and the body cannot respond normally to the insulin that is made (type 2 diabetes).  Without insulin, death can occur. Diabetes requires lifelong monitoring and treatment. This document will discuss the role of insulin in your treatment and provide information about insulin.  LIFESTYLE CHOICES Lifestyle choices can affect both the control of your diabetes and your risk of complications from diabetes. Lifestyle choices are critically important in the overall management of diabetes. They are important even if you do not need to take insulin.  Eat a healthy diet. Ask for help if you need it.   Exercise regularly. Ask for help if you need it.   Diet and exercise can help:   Reduce the amount of insulin you need.   Your body to  use your insulin more effectively.   Reduce your risk of high blood pressure (or reduce your blood pressure, if it is high).   Reduce your cholesterol level.   Reduce your weight.  Healthy lifestyle choices play an important role in controlling cardiovascular disease (heart attack, stroke, vascular disease, and others), which is a primary complication of diabetes. For optimal control of diabetes, you must reduce your cardiovascular risks and manage your blood sugar. MEDICATIONS BESIDES INSULIN  Your caregiver may recommend medicines for you in addition to insulin. This will depend on 3 things:   Your diabetes type.   How well insulin alone meets your treatment goals.   Other health factors.  There are different types of medicines that help treat diabetes. The goal is to control your blood sugar the best way possible, which will reduce your risk of complications. Adding other medicines may also reduce the amount of insulin you need.  INSULIN  People with type 1 diabetes must take insulin to stay alive. Their body does not produce it. People with type 2 diabetes might require insulin in addition to, or instead of, other medicines. In either case, proper use of insulin is critical to control your diabetes.  There are a number of different types of insulin. Usually, you will give yourself injections, though others can be trained to give them to you. Some people have an insulin pump that delivers insulin continuously through a soft, flexible tube (canula) that is placedunder the skin of the abdomen.Other sites including the hips, thighs, or upper arms may also be used.  Using insulin requires that you check your blood sugar several times a day. The exact number of times and time of day to check your blood sugar will vary depending on your type of diabetes, your type of insulin, and treatment goals. Your caregiver will direct you.  Generally, different insulins have different properties. The  following is a general guide. Specifics will vary by product, and new products are introduced periodically.   Short-acting insulin starts working quickly (in as little as 5 minutes) and wears off in 3 to 6 hours (sometimes longer). This type of insulin works well when taken before a meal to bring your blood sugar quickly back to normal. There are several different types of short-acting insulin. Some work quickly and others last longer in your system.   Intermediate-acting insulin starts working in 2 hours and wears off after about 10 to18 hours. This insulin will lower your blood sugar for a longer period of time, but will not be as effective in lowering your blood sugar right after a meal.   Long-acting insulin mimics the small amount of insulin that your pancreas usually  produces throughout the day. You need to have some insulin present at all times, as it is crucial to the metabolism of brain cells and other cells. Long-acting insulin is meant to be used either once or twice a day. It is usually used in combination with other types of insulin, or in combination with other diabetes medicines.  Discuss the type of insulin you are taking with your caregiver or pharmacist. You will then be aware of when the insulin can be expected to peak and when it will wear off.  Your caregiver will usually have a strategy in mind when treating you with insulin. This will vary with your type of diabetes, your diabetes treatment goals, and your health history. It is important that you understand something about this strategy so you may be a partner in treating your diabetes. Here are some terms you might hear:  Basal insulin. You need to have a small amount of insulin present in your blood at all times. Sometimes oral medicines will be enough. For other people, and especially for people with type 1 diabetes, insulin is needed. Usually, intermediate-acting or long-acting insulin is used once or twice a day to accomplish  this.   Prandial (meal-related) insulin. Your blood sugar will rise rapidly after a meal. Short-acting insulin can be used right before the meal to bring your blood sugar back to normal quickly. You might be instructed to adjust the amount of insulin depending on how much carbohydrate (starch) is in your meal.   Corrective insulin. You might be instructed to check your blood sugar at certain times of the day. You then might use a small amount of short-acting insulin to bring the blood sugar down to normal if it is elevated.   Tight control (also called intensive therapy). Tight control is keeping your blood sugar as close to your target as possible and keeping it from going too high after meals. People with tight control of their diabetes may have fewer long-term complications from their diabetes.   Glycohemoglobin (also called glyco, glycosylated hemoglobin, Hemoglobin A1c, or A1c) level. This measures how well your blood sugar has been controlled during the past 1 to 3 months. It helps your caregiver see how effective your treatment is and decide if any changes are needed. Your caregiver will discuss your target glycohemoglobin with you.  Insulin treatment requires your careful attention. Treatment plans will be different for different people. Some people do well with a simple program. Others require more complicated programs with multiple insulin injections daily. You will work with your caregiver to develop the best program for you. Regardless of your insulin treatment plan, you must also do your best on weight control, diet and food choices, exercise, blood pressure control, and cholesterol control.  BENEFITS OF DIABETES TREATMENT  Research shows that optimal treatment of your diabetes will reduce the risk of kidney, eye, and nerve complications. If you have type 1 diabetes, your risk of heart and vascular disease also decreases with good diabetes control. The better you control your blood sugars  (and the lower your glycohemoglobin), the lower your risk of complications. RISKS OF INSULIN Although insulin treatment is important, it does have some risks. Insulin treatment may be complex, but it is critical for maintaining your good health. Frequent follow-up visits with your caregiver, at least early on, are usually needed.  Insulin can cause your blood sugar to go too low (hypoglycemia). This can be a dangerous complication that must be quickly recognized and treated.  Weight gain can occur.   Improper injection technique can cause hypoglycemia, skin injury or irritation, or other problems. You must learn to inject insulin properly.   Other medicines used for diabetes can have other complications. Discuss your medicines and their complications with your caregiver.  GOALS OF DIABETES CARE  The goal of treating your diabetes is to allow you to live as long as you can with as few complications as possible. Several factors help you work towards this goal:  You should achieve a blood sugar as close to normal as possible without causing hypoglycemia. Generally, this means a glycohemoglobin of between 6% and 7%.   You should achieve and maintain an ideal body weight.   You should exercise regularly.   Your blood pressure should be under 130/80.   Your cholesterol should be controlled, with your LDL under 100. Your target may be different depending on your health factors.   You should not smoke.   You should be up to date on immunizations, including influenza and pneumonia.   You should be monitored for eye, kidney, heart, and vascular health regularly. Preventative medicines are sometimes used for these conditions.  Your specific goals may vary, depending on your health factors. You should discuss issues with your caregiver.  HOME CARE INSTRUCTIONS   Do not smoke.   Eat a healthy diet as instructed. Ask for help if you need it.   Exercise regularly. Ask for help if you need it.     Stay up to date on your immunizations.   See your caregiver on a regular basis.   Follow your diabetes care plan carefully. Take medicines and insulin as directed. Check your blood sugar as directed, and keep track of it in a log. Understand your glycohemoglobin and other diabetes goals. Understand how to detect and treat hypoglycemia.   Follow your care plan for blood pressure and elevated cholesterol if you have these problems.   Do your blood tests as directed. This is important and helps monitor your diabetes.   Check your feet every night for sores or wounds.   See your eye doctor once a year.   If you have an illness that causes loss of appetite, vomiting, or diarrhea, you should speak with your caregiver about temporary changes in your insulin doses and other medicines.  SEEK MEDICAL CARE IF:  You are having problems keeping your blood sugar at target range.   You are having episodes of hypoglycemia.   You are having side effects from your medicines.   You have symptoms of an illness that are not improving after 3 to 4 days.   You have a sore or wound that is not healing.   You notice a change in vision or a new problem with your vision.   You develop a fever of more than 100.5 F (38.1 C).  SEEK IMMEDIATE MEDICAL CARE IF:   Your blood sugar goes below 70, especially if you have confusion, lightheadedness, or other symptoms.   Your blood sugar is very high (as advised by your caregiver) twice in a row.   An unexplained oral temperature above 102 F (38.9 C) develops.   You pass out.   You have chest pain or trouble breathing.   You have a sudden, severe headache.   You have sudden weakness in one arm or leg.   You have sudden difficulty speaking or swallowing.   You develop vomiting or diarrhea that is getting worse or not improving after 1  day.  Document Released: 11/23/2008 Document Revised: 08/16/2011 Document Reviewed: 12/21/2010 [facility] Patient  Information 2012 [place], [place].Monitoring for Diabetes There are two blood tests that help you monitor and manage your diabetes. These include:  An A1c (hemoglobin A1c) test.   This test is an average of your glucose (or blood sugar) control over the past 3 months.   This is recommended as a way for you and your caregiver to understand how well your glucose levels are controlled on the average.   Your A1c goal will be determined by your caregiver, but it is usually best if it is less than 6.5% to 7.0%.   Glucose (sugar) attaches itself to red blood cells. The amount of glucose then can then be measured. The amount of glucose on the cells depends on how high your blood glucose has been.   SMBG test (self-monitoring blood glucose).   Using a blood glucose monitor (meter) to do SMBG testing is an easy way to monitor the amount of glucose in your blood and can help you improve your control. The monitor will tell you what your blood glucose is at that very moment. Every person with diabetes should have a blood glucose monitor and know how to use it. The better you control your blood sugar on a daily basis, the better your A1c levels will be.  HOW OFTEN SHOULD I HAVE AN A1C LEVEL?  Every 3 months if your diabetes is not well controlled or if therapy has changed.   Every 6 months if you are meeting your treatment goals.  HOW OFTEN SHOULD I DO SMBG TESTING?  Your caregiver will recommend how often you should test. Testing times are based on the kind of medicine you take, type of diabetes you have, and your blood glucose control. Testing times can include:  Type 1 diabetes: test 3 or 4 times a day or as directed.   Type 2 diabetes and if you are taking insulin and diabetes pills: test 3 or 4 times a day or as directed.   If you are taking diabetes pills only and not reaching your target A1c: test 2 to 4 times a day or as directed.   If you are taking diabetes pills and are controlling your  diabetes well with diet and exercise, your caregiver will help you decide what is appropriate.  WHAT TIME OF DAY SHOULD I TEST?  The best time of day to test your blood glucose depends on medications, mealtimes, exercise, and blood glucose control. It is best to test at different times because this will help you know how you are doing throughout the day. Your caregiver will help you decide what is best. WHAT SHOULD MY BLOOD GLUCOSE BE? Blood glucose target goals may vary depending on each persons needs, whether they have type 1 or type 2 diabetes or what medications they are taking. However, as a general rule, blood glucose should be:  Before meals...70-130 mg/dl.   After meals .Marland Kitchen...less than 180 mg/dl.  CHECK YOUR BLOOD GLUCOSE IF:  You have symptoms of low blood sugar (hypoglycemia), which may include dizziness, shaking, sweating, chills and confusion.   You have symptoms of high blood sugar (hyperglycemia), which may include sleepiness, blurred vision, frequent urination and excessive thirst.   You are learning how meals, physical activity and medicine affect your blood glucose level. The more you learn about how various foods, your medications, and activities affect you, the better job you will do of taking care of yourself.  You have a job in which poor control could cause safety problems while driving or operating machinery.  CHECK YOUR BLOOD SUGAR MORE FREQUENTLY:  If you have medication or dietary changes.   If you begin taking other kinds of medicines.   If you become sick or your level of stress increases. With an illness, your blood sugar may even be high without eating.   Before and after exercise.  Follow your caregiver's testing recommendations during this time.  TO DISPOSE OF SHARPS: Each city or state may have different regulations. Check with your public works or Engineer, structural.  Sharps containers can be purchased from [employer] all used sharps  in a container. You do not need to replace any protective covers over the needle or break the needle.   Sharps should be contained in a ridge, leakproof, puncture-resistant container.   Plastic detergent bottle.   Bleach bottle.   When container is almost full, add a solution that is 1 part laundry bleach and 9 parts tap water (it is ok to use undiluted bleach if you wish). You may want to wear gloves since bleach can damage tissue. Let the solution sit for 30 minutes.   Carefully pour all the liquid into the sanitary sewer. Be sure to prevent the sharps from falling out.   Once liquid is drained, reseal the container with lid and tape it shut with duct tape. This will prevent the cap from coming off.   Dispose of the container with your regular household trash and waste. It is a good idea to let your trash hauler know that you will be disposing of sharps.  Document Released: 08/30/2003 Document Revised: 08/16/2011 Document Reviewed: 02/28/2009 [facility] Patient Information 2012 [place], [place].Kidney Failure Kidney failure happens when the kidneys cannot remove waste and excess fluid that naturally builds up in your blood after your body breaks down food. This leads to a dangerous buildup of waste products and fluid in the blood. HOME CARE  Follow your diet as told by your doctor.   Take all medicines as told by your doctor.   Keep all of your dialysis appointments. Call if you are unable to keep an appointment.  GET HELP RIGHT AWAY IF:   You make a lot more or very little pee (urine).   Your face or ankles puff up (swell).   You develop shortness of breath.   You develop weakness, feel tired, or you do not feel hungry (appetite loss).   You feel poorly for no known reason.  MAKE SURE YOU:   Understand these instructions.   Will watch your condition.   Will get help right away if you are not doing well or get worse.  Document Released: 11/21/2009 Document Revised:  08/16/2011 Document Reviewed: 12/28/2009 [facility] Patient Information 2012 [place], [place].  RESOURCE GUIDE  Dental Problems  Patients with Medicaid: [facility] [phone] [address]                                           [phone] [address] Phone:  [phone]  Phone:  [phone]  If unable to pay or uninsured, contact:  [facility] or [facility]. to become qualified for the adult dental clinic.  Chronic Pain Problems Contact [facility]  [phone] Patients need to be referred by their primary care doctor.  Insufficient Money for Medicine Contact [employer]:  call "211" or [facility] [phone].  No Primary Care Doctor Call [facility]  [phone] Other agencies that provide inexpensive medical care    [facility]  [phone]    [facility]  [phone]    [facility]  [phone]    [facility]  9097366234    [facility]  [phone]    [facility]  [phone]  Psychological Services [facility]  [phone] [facility] Services  [phone] [facility]   [phone] (emergency services [phone])  Substance Abuse Resources [facility]  [phone] [facility] [phone] [facility] [place] [phone] Floydene Flock [phone] Residential & Outpatient Substance Abuse Program  [phone]  Abuse/Neglect [facility] Child Abuse Hotline 3433661193 [place] [facility] Child Abuse Hotline 8597015541 (After Hours)  Emergency Shelter [facility] [phone]  Maternity Homes [facility] at [facility] [place] [facility] [phone] [facility] [phone]  MRSA Hotline #:   757-625-6855    [facility] Resources  [facility] [place]     [employer]                           [facility] Dept. [address] [place]                       [address]      [address]  [name] Phone:  [phone]                                   Phone:  [phone]                 Phone:  [phone]  [facility] Phone:  [phone]  [facility] Child Abuse Hotline 2204804302 [phone] (After Hours)

## 2012-01-03 NOTE — ED Provider Notes (Signed)
History    60yM hyperglycemia. Patient was called by his physician's office to come to the emergency room after blood work showed a markedly elevated glucose. Patient complaining of generalized fatigue. Has been going on for several weeks. No other complaints at this time. Patient admits to noncompliance with his insulin. Says he has not taken in over a month. He is noncompliant with his blood pressure medication as well. He's not sure what exactly he is seen in supposed to be taking. Denies pain anywhere. No n/v. No fever or chills. No urinary complaints.  CSN: [id number]  Arrival date & time 01/03/12  1020   First MD Initiated Contact with Patient 01/03/12 1101      Chief Complaint  Patient presents with  . Hyperglycemia    (Consider location/radiation/quality/duration/timing/severity/associated sxs/prior treatment) HPI  Past Medical History  Diagnosis Date  . Diabetes mellitus     History reviewed. No pertinent past surgical history.  History reviewed. No pertinent family history.  History  Substance Use Topics  . Smoking status: Never Smoker   . Smokeless tobacco: Not on file  . Alcohol Use: No      Review of Systems   Review of symptoms negative unless otherwise noted in HPI.   Allergies  Review of patient's allergies indicates no known allergies.  Home Medications  No current outpatient prescriptions on file.  BP 107/82  Pulse 92  Temp(Src) 97.6 F (36.4 C) (Oral)  Resp 18  SpO2 100%  Physical Exam  Nursing note and vitals reviewed. Constitutional: He is oriented to person, place, and time. He appears well-developed and well-nourished. No distress.       Sitting up in bed. NAD.   HENT:  Head: Normocephalic and atraumatic.  Mouth/Throat: Oropharynx is clear and moist.  Eyes: Conjunctivae are normal. Pupils are equal, round, and reactive to light. Right eye exhibits no discharge. Left eye exhibits no discharge.  Neck: Neck supple.  Cardiovascular:  Normal rate, regular rhythm and normal heart sounds.  Exam reveals no gallop and no friction rub.   No murmur heard. Pulmonary/Chest: Effort normal and breath sounds normal. No respiratory distress.  Abdominal: Soft. He exhibits no distension. There is no tenderness.  Musculoskeletal: He exhibits no edema and no tenderness.  Neurological: He is alert and oriented to person, place, and time. No cranial nerve deficit. He exhibits normal muscle tone. Coordination normal.  Skin: Skin is warm and dry. He is not diaphoretic.  Psychiatric: He has a normal mood and affect. His behavior is normal. Thought content normal.    ED Course  Procedures (including critical care time)  Labs Reviewed  GLUCOSE, CAPILLARY - Abnormal; Notable for the following:    Glucose-Capillary >600 (*)    All other components within normal limits  BASIC METABOLIC PANEL - Abnormal; Notable for the following:    Sodium 127 (*)    Chloride 94 (*)    Glucose, Bld 596 (*)    BUN 35 (*)    Creatinine, Ser 1.76 (*)    GFR calc non Af Amer 41 (*)    GFR calc Af Amer 47 (*)    All other components within normal limits  CBC - Abnormal; Notable for the following:    RBC 5.99 (*)    Hemoglobin 11.6 (*)    HCT 35.4 (*)    MCV 59.1 (*)    MCH 19.4 (*)    RDW 16.1 (*)    Platelets 131 (*)    All other components  within normal limits  POCT I-STAT 3, BLOOD GAS (G3P V) - Abnormal; Notable for the following:    pCO2, Ven 51.5 (*)    Acid-base deficit 5.0 (*)    All other components within normal limits  GLUCOSE, CAPILLARY - Abnormal; Notable for the following:    Glucose-Capillary 292 (*)    All other components within normal limits  URINALYSIS, ROUTINE W REFLEX MICROSCOPIC  BLOOD GAS, VENOUS   No results found.   1. Uncontrolled diabetes mellitus   2. Renal insufficiency       MDM  60yM with poorly controlled diabetes. Hyperglycemic but bicarb normal. Noncompliant with medications. Pt unsure of what medications  he is supposed to be taking. DC summary from 06/2011 lists 1. Lisinopril 10 mg daily. 2. Gabapentin 300 mg t.i.d. 3. Lopressor 25 mg b.i.d. 4. Insulin; 38 units at bedtime insulin Lantus and then regular insulin 6 units t.i.d. with meals. Will discuss with CM to see if can assist in obtaining these and testing strips. Renal insufficiency, likely pre-renal with BUN:Cr of 20:1. Bolused IVF.   3:20 PM Case management evaluated the patient. His pharmacy was called and apparently patient does have current insurance. Will provide prescriptions for his medications. Suspect that underlying issue is simple forgetfulness possibly related to his history of prior traumatic brain injury. Case management arranged for home health. Pt normotensive in ED. Will defer from giving prescription for metoprolol at this time but given script for lisinopril. Given Cr slightly elevated pt instructed to hold this though for 3 days. Discussed importance of following back up with PCP. Lantus started at 18u instead of 38 units. Concern for possible hypoglycemia if pt actually takes meds as prescribed. This as well as HTN meds can be further adjusted by PCP if needed. Return precautions discussed.        Raeford Razor, MD 01/03/12 [phone]

## 2012-01-03 NOTE — ED Notes (Signed)
Critical lab glucose 596 per lab. Notified EDP.

## 2012-01-03 NOTE — Progress Notes (Signed)
   CARE MANAGEMENT NOTE 01/03/2012  Patient:  Gordon Young, Gordon Young   Account Number:  [id number]  Date Initiated:  01/03/2012  Documentation initiated by:  GRAVES-BIGELOW,Lakota Schweppe  Subjective/Objective Assessment:   Pt admitted with elevated blood sugar. Pt had been noncompliatn with medicaitons. CM did speak to pt and he will not receive check until 01-11-12. CM did call Son Lonni Fix and he will assist with medications this month. Pt has insurance.     Action/Plan:   CM did relay to pt that [facility] RN will be beneficial for [employer] and a SW for social needs. Pt is agreeable.   Anticipated DC Date:  01/03/2012   Anticipated DC Plan:  HOME W HOME HEALTH SERVICES      DC Planning Services  CM consult      [facility] Choice  HOME HEALTH   Choice offered to / List presented to:  C-1 Patient        HH arranged  HH-1 RN  HH-10 DISEASE MANAGEMENT  HH-6 SOCIAL WORKER      HH agency  [facility]   Status of service:  Completed, signed off Medicare Important Message given?   (If response is "NO", the following Medicare IM given date fields will be blank) Date Medicare IM given:   Date Additional Medicare IM given:    Discharge Disposition:  HOME W HOME HEALTH SERVICES  Per UR Regulation:    If discussed at Long Length of Stay Meetings, dates discussed:    Comments:  01-03-12 1559 Tomi Bamberger, RN,BSN [phone] CM did discuss with son and they will assist with medicaitons this month. Nolan arranged fro transportation home and to the pharmacy. No other needs from CM at this time.

## 2012-01-03 NOTE — ED Notes (Signed)
1255 vitals performed by [facility], EMT

## 2012-01-03 NOTE — ED Notes (Signed)
Abnormal labs given to Dr. Kohut 

## 2012-01-03 NOTE — ED Notes (Signed)
POCT CBG resulted 292; Tammy Sours, RN notified

## 2012-01-03 NOTE — ED Notes (Signed)
Case management at bedside.

## 2012-01-03 NOTE — ED Notes (Signed)
Patient states is a diabetic and has not taken his insulin for approx 3-4 weeks ran out and has not checked his blood sugar because he ran out of strips.  Ax4 airway intact denies any pain 0/10. States urinary frequency intermittently. Has recently had blood drawn and Doctor's office called and stated blood sugar was high and go to the ED for evaluation.   Patient resting comfortably on stretcher watching TV.

## 2012-01-03 NOTE — ED Notes (Signed)
CBG was 261. Notified Nurse Tammy Sours.

## 2012-09-10 HISTORY — PX: COLONOSCOPY: SHX174

## 2013-07-07 ENCOUNTER — Encounter: Payer: Self-pay | Admitting: Radiation Oncology

## 2013-07-07 DIAGNOSIS — C61 Malignant neoplasm of prostate: Secondary | ICD-10-CM

## 2013-07-07 NOTE — Progress Notes (Signed)
GU Location of Tumor / Histology: adenocarcinoma of the prostate  If Prostate Cancer, Gleason Score is (3 + 3=6) in 3 cores with 5% or less involvement of each core. and PSA is (6.5) on 04/06/2013. Prostate volume: 72 cc  Patient presented September of this year to Dr. Vernie Ammons referred by Dr. Julio Sicks for elevated PSA.  Biopsies of prostate (if applicable) revealed:     Past/Anticipated interventions by urology, if any: 3 oral agents tried but not effect then, prescribed Androgel for testosterone replacement therapy; referred to Dr. Kathrynn Running   Past/Anticipated interventions by medical oncology, if any: N/A  Weight changes, if any: Denies  Bowel/Bladder complaints, if any: mild voiding symptoms consisting of frequency, hesitancy and nocturia.    Nausea/Vomiting, if any: Denies  Pain issues, if any:  Denies any flank pain or bone pain  SAFETY ISSUES:  Prior radiation? NO  Pacemaker/ICD? NO  Possible current pregnancy? N/A  Is the patient on methotrexate? N/A  Current Complaints / other details:  62 year old male. Married. Five children. NKDA.

## 2013-07-08 ENCOUNTER — Telehealth: Payer: Self-pay | Admitting: *Deleted

## 2013-07-08 ENCOUNTER — Encounter: Payer: Self-pay | Admitting: Radiation Oncology

## 2013-07-08 ENCOUNTER — Ambulatory Visit
Admission: RE | Admit: 2013-07-08 | Discharge: 2013-07-08 | Disposition: A | Discharge status: Home / Self Care | Payer: Medicare Other | Source: Ambulatory Visit | Attending: Radiation Oncology | Admitting: Radiation Oncology

## 2013-07-08 VITALS — BP 153/106 | HR 93 | Temp 98.0°F | Resp 16 | Ht 67.0 in | Wt 206.2 lb

## 2013-07-08 DIAGNOSIS — C61 Malignant neoplasm of prostate: Secondary | ICD-10-CM

## 2013-07-08 DIAGNOSIS — Z794 Long term (current) use of insulin: Secondary | ICD-10-CM | POA: Insufficient documentation

## 2013-07-08 DIAGNOSIS — N400 Enlarged prostate without lower urinary tract symptoms: Secondary | ICD-10-CM | POA: Insufficient documentation

## 2013-07-08 DIAGNOSIS — E7800 Pure hypercholesterolemia, unspecified: Secondary | ICD-10-CM | POA: Insufficient documentation

## 2013-07-08 DIAGNOSIS — I10 Essential (primary) hypertension: Secondary | ICD-10-CM | POA: Insufficient documentation

## 2013-07-08 DIAGNOSIS — Z79899 Other long term (current) drug therapy: Secondary | ICD-10-CM | POA: Insufficient documentation

## 2013-07-08 DIAGNOSIS — E119 Type 2 diabetes mellitus without complications: Secondary | ICD-10-CM | POA: Insufficient documentation

## 2013-07-08 NOTE — Progress Notes (Signed)
Reports on average he gets up twice during the night to void. IPSS 13. Denies dysuria. Reports hematuria has resolved since biopsy. Reports urine stream is weak. Reports during the day he voids approximately every two hours. Reports urgency. Reports occasional hesitancy. Denies diarrhea, blood in stool or pain associated with bowel movement. However, patient reports that he daily gives himself an enema. Reports occasional headaches. Reports intermittent pain in both shoulder. Denies night sweats or weight loss. Blood pressure elevated. Patient reports that he has not been able to afford any of his routine medication to include his insulin in two months. Patient reports sometimes he doesn't even have enough money to eat.

## 2013-07-08 NOTE — Telephone Encounter (Signed)
Called patient to inform of appt. With Dr. Vernie Ammons for 07-14-13- arrvial time - 8 am for Avodart, lvm for a return call.

## 2013-07-08 NOTE — Progress Notes (Signed)
See progress note under physician encounter. 

## 2013-07-08 NOTE — Progress Notes (Signed)
Radiation Oncology         [id number]) [phone] ________________________________  Initial outpatient Consultation  Name: Gordon Young MRN: [id number]  Date: 07/08/2013  DOB: 10-Jul-1951  KV:[id number],VFIEPPIRJ, MD  Garnett Farm, MD   REFERRING PHYSICIAN: Garnett Farm, MD  DIAGNOSIS: 62 y.o. gentleman with stage T1c adenocarcinoma of the prostate with a Gleason's score of 3+3 and a PSA of 6.5  HISTORY OF PRESENT ILLNESS::Gordon Young is a 61 y.o. gentleman.  He was noted to have an elevated PSA of 6.5 by his primary care physician, Dr. Julio Sicks.  Accordingly, he was referred for evaluation in urology by Dr. Vernie Ammons,  digital rectal examination was performed at that time revealing no nodules.  The patient proceeded to transrectal ultrasound with 12 biopsies of the prostate on 3.  The prostate volume measured 72 cc.  Out of 12 core biopsies,3 were positive.  The maximum Gleason score was 3+3, and this was seen in right apex, left lateral base, and left apex.  The patient reviewed the biopsy results with his urologist and he has kindly been referred today for discussion of potential radiation treatment options.  PREVIOUS RADIATION THERAPY: No  PAST MEDICAL HISTORY:  has a past medical history of Anxiety state, unspecified; Arthritis; Depressive disorder, not elsewhere classified; Pure hypercholesterolemia; Hypertension; Prostate cancer; Organic erectile dysfunction; Hypogonadism male; and Diabetes mellitus.    PAST SURGICAL HISTORY: Past Surgical History  Procedure Laterality Date  . Colonoscopy    . External ear surgery    . Procedure of head    . Endoscopic ultrasound upper gastrointestinal gastric    . Prostate biopsy      FAMILY HISTORY: family history includes Stroke in his father.  SOCIAL HISTORY:  reports that he has never smoked. He has never used smokeless tobacco. He reports that he does not drink alcohol or use illicit drugs.  ALLERGIES: Review of patient's allergies  indicates no known allergies.  MEDICATIONS:  Current Outpatient Prescriptions  Medication Sig Dispense Refill  . atorvastatin (LIPITOR) 40 MG tablet Take 40 mg by mouth daily.      Marland Kitchen gabapentin (NEURONTIN) 300 MG capsule Take 1 capsule (300 mg total) by mouth 3 (three) times daily.  90 capsule  2  . insulin glargine (LANTUS) 100 UNIT/ML injection Inject 18 Units into the skin at bedtime.  10 mL  12  . insulin regular (HUMULIN R) 100 units/mL injection Inject 0.06 mLs (6 Units total) into the skin 3 (three) times daily before meals.  10 mL  12  . lisinopril (PRINIVIL,ZESTRIL) 10 MG tablet Take 1 tablet (10 mg total) by mouth daily.  30 tablet  2   No current facility-administered medications for this encounter.    REVIEW OF SYSTEMS:  A 15 point review of systems is documented in the electronic medical record. This was obtained by the nursing staff. However, I reviewed this with the patient to discuss relevant findings and make appropriate changes.  A comprehensive review of systems was negative..  The patient completed an IPSS and IIEF questionnaire.  His IPSS score was 13  indicating moderate urinary outflow obstructive symptoms.  He indicated that his erectile function is not active.   PHYSICAL EXAM: This patient is in no acute distress.  He is alert and oriented.   height is 5\' 7"  (1.702 m) and weight is 206 lb 3.2 oz (93.532 kg). His oral temperature is 98 F (36.7 C). His blood pressure is 153/106 and his pulse is 93. His  respiration is 16 and oxygen saturation is 100%.  He exhibits no respiratory distress or labored breathing.  He appears neurologically intact.  His mood is pleasant.  His affect is appropriate.  Please note the digital rectal exam findings described above.  KPS = 100 LABORATORY DATA:  Lab Results  Component Value Date   WBC 6.2 01/03/2012   HGB 11.6* 01/03/2012   HCT 35.4* 01/03/2012   MCV 59.1* 01/03/2012   PLT 131* 01/03/2012   Lab Results  Component Value Date   NA  127* 01/03/2012   K 5.0 01/03/2012   CL 94* 01/03/2012   CO2 22 01/03/2012   Lab Results  Component Value Date   ALT 31 06/26/2011   AST 29 06/26/2011   ALKPHOS 84 06/26/2011   BILITOT 0.4 06/26/2011     RADIOGRAPHY: No results found.    IMPRESSION: This gentleman is a  62 y.o. 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  His T-Stage, Gleason's Score, and PSA put him into the favorable risk group.  Accordingly he is eligible for a variety of potential treatment options including active surveillance, prostatectomy, external radiation and seed implant.  PLAN:Today I reviewed the findings and workup thus far.  We discussed the natural history of prostate cancer.  We reviewed the the implications of T-stage, Gleason's Score, and PSA on decision-making and outcomes in prostate cancer.  We discussed radiation treatment in the management of prostate cancer with regard to the logistics and delivery of external beam radiation treatment as well as the logistics and delivery of prostate brachytherapy.  We compared and contrasted each of these approaches and also compared these against prostatectomy.  The patient expressed interest in prostate brachytherapy.  I filled out a patient counseling form for him with relevant treatment diagrams and we retained a copy for our records.   He has an enlarged prostate and may benefit from 3 months of  Avodart or Proscar.  The patient would like to proceed with prostate brachytherapy.  I will share my findings with Dr. Vernie Ammons and move forward with scheduling the procedure in the near future.     I enjoyed meeting with him today, and will look forward to participating in the care of this very nice gentleman.   I spent 60 minutes face to face with the patient and more than 50% of that time was spent in counseling and/or coordination of care.   ------------------------------------------------  Artist Pais.  Kathrynn Running, M.D.

## 2013-07-08 NOTE — Progress Notes (Signed)
Complete PATIENT OF DISTRESS worksheet with a score of 2 submitted to social work. He expresses that transportation is an issue and money to afford food. Also, he explains that he has been without his diabetic medication for two months because he can't afford it. Contact Albin Felling to aid in these matters.

## 2013-07-08 NOTE — Progress Notes (Signed)
Met with patient per RN request.  Financial concerns for paying bills and transportation.  Gave patient MCD application and SCAT application.  Advised patient to bring disability payment information and bank statement back so we could see if he qualifies for the [facility] funds.

## 2013-07-09 ENCOUNTER — Telehealth: Payer: Self-pay | Admitting: *Deleted

## 2013-07-09 NOTE — Telephone Encounter (Signed)
Returned patients phone call   

## 2013-07-09 NOTE — Telephone Encounter (Signed)
CALLED PATIENT TO INFORM OF APPT. WITH DR. Vernie Ammons ON NOV. 4, LVM FOR A RETURN CALL, MAILED APPT. CARD TO HIM

## 2013-07-16 NOTE — Progress Notes (Signed)
[facility] Psychosocial Distress Screening Clinical Social Work  Patient returned call.  Patient stated "everything is fine".  Patient mentioned "waiting on card from finance to pay bills".  Patient was informed about SW services and understands availability for future needs. Clinical Social Worker follow up needed: no  If yes, follow up plan:   Joelyn Lover [facility] Clinical Social Work Intern [employer] [phone]

## 2013-07-16 NOTE — Progress Notes (Signed)
[facility] Psychosocial Distress Screening Clinical Social Work  Clinical Social Work was referred by distress screening protocol.  The patient scored a 2 on the Psychosocial Distress Thermometer which indicates mild distress. Clinical Social Worker Intern telephoned to assess for distress and other psychosocial needs. Patient did not answer phone and message was left for return call to SW if necessary.   Clinical Social Worker follow up needed: no  If yes, follow up plan:   Khilee Hendricksen S. [facility] Clinical Social Work Intern [employer] [facility] [phone]

## 2013-07-22 ENCOUNTER — Telehealth: Payer: Self-pay | Admitting: Radiation Oncology

## 2013-07-22 NOTE — Telephone Encounter (Signed)
Patient left message requesting Dr. Kathrynn Running refill his bp medication. Returned patient's call. No answer. Left message.

## 2013-09-16 ENCOUNTER — Telehealth: Payer: Self-pay | Admitting: *Deleted

## 2013-09-16 ENCOUNTER — Other Ambulatory Visit: Payer: Self-pay | Admitting: Urology

## 2013-09-16 NOTE — Telephone Encounter (Signed)
CALLED PATIENT TO INFORM OF IMPLANT DATE, LVM FOR A RETURN CALL 

## 2013-09-17 ENCOUNTER — Telehealth: Payer: Self-pay | Admitting: *Deleted

## 2013-09-17 NOTE — Telephone Encounter (Signed)
CALLED PATIENT TO INFORM OF IMPLANT DATE, LVM FOR A RETURN CALL 

## 2013-09-18 ENCOUNTER — Telehealth: Payer: Self-pay | Admitting: *Deleted

## 2013-09-18 NOTE — Telephone Encounter (Signed)
CALLED PATIENT TO INFORM OF IMPLANT DATE AND TO ASK ABOUT GETTING CHEST X-RAY AND EKG, LVM FOR A RETURN CALL

## 2013-09-21 ENCOUNTER — Telehealth: Payer: Self-pay | Admitting: *Deleted

## 2013-09-21 NOTE — Telephone Encounter (Signed)
Called patient to ask about coming in on 09-23-13 for his pre-seed scan, patient agreed to come in at 1:00 p.m.

## 2013-09-21 NOTE — Telephone Encounter (Signed)
Called patient to ask about coming in for a pre-seed scan and a chest x-ray and an EKG, I told him that I would need to speak with Dr. Tammi Klippel and get back with him later.

## 2013-09-22 ENCOUNTER — Telehealth: Payer: Self-pay | Admitting: *Deleted

## 2013-09-22 NOTE — Telephone Encounter (Signed)
CALLED PATIENT TO REMIND OF APPT. FOR 09-23-13, LVM FOR A RETURN CALL

## 2013-09-23 ENCOUNTER — Ambulatory Visit (HOSPITAL_BASED_OUTPATIENT_CLINIC_OR_DEPARTMENT_OTHER)
Admission: RE | Admit: 2013-09-23 | Discharge: 2013-09-23 | Disposition: A | Discharge status: Home / Self Care | Payer: Medicare HMO | Source: Ambulatory Visit | Attending: Urology | Admitting: Urology

## 2013-09-23 ENCOUNTER — Other Ambulatory Visit: Payer: Self-pay

## 2013-09-23 ENCOUNTER — Encounter (HOSPITAL_BASED_OUTPATIENT_CLINIC_OR_DEPARTMENT_OTHER)
Admission: RE | Admit: 2013-09-23 | Discharge: 2013-09-23 | Disposition: A | Discharge status: Home / Self Care | Payer: Medicare HMO | Source: Ambulatory Visit | Attending: Urology | Admitting: Urology

## 2013-09-23 ENCOUNTER — Ambulatory Visit
Admission: RE | Admit: 2013-09-23 | Discharge: 2013-09-23 | Disposition: A | Discharge status: Home / Self Care | Payer: Medicare HMO | Source: Ambulatory Visit | Attending: Radiation Oncology | Admitting: Radiation Oncology

## 2013-09-23 DIAGNOSIS — R3911 Hesitancy of micturition: Secondary | ICD-10-CM | POA: Insufficient documentation

## 2013-09-23 DIAGNOSIS — Z01818 Encounter for other preprocedural examination: Secondary | ICD-10-CM | POA: Insufficient documentation

## 2013-09-23 DIAGNOSIS — C61 Malignant neoplasm of prostate: Secondary | ICD-10-CM | POA: Insufficient documentation

## 2013-09-23 DIAGNOSIS — Z79899 Other long term (current) drug therapy: Secondary | ICD-10-CM | POA: Insufficient documentation

## 2013-09-23 DIAGNOSIS — Z794 Long term (current) use of insulin: Secondary | ICD-10-CM | POA: Insufficient documentation

## 2013-09-23 DIAGNOSIS — I771 Stricture of artery: Secondary | ICD-10-CM | POA: Insufficient documentation

## 2013-09-23 DIAGNOSIS — Z01810 Encounter for preprocedural cardiovascular examination: Secondary | ICD-10-CM | POA: Insufficient documentation

## 2013-09-23 DIAGNOSIS — R350 Frequency of micturition: Secondary | ICD-10-CM | POA: Insufficient documentation

## 2013-09-23 NOTE — Progress Notes (Signed)
  Radiation Oncology         (336) [phone] ________________________________  Name: Gordon Young MRN: [id number]  Date: 09/23/2013  DOB: 18-Jul-1951  SIMULATION AND TREATMENT PLANNING NOTE PUBIC ARCH STUDY  OM:[email or web address],[id number], MD  Claybon Jabs, MD  DIAGNOSIS: 63 y.o. gentleman with stage T1c adenocarcinoma of the prostate with a Gleason's score of 3+3 and a PSA of 6.5  COMPLEX SIMULATION:  The patient presented today for evaluation for possible prostate seed implant. He was brought to the radiation planning suite and placed supine on the CT couch. A 3-dimensional image study set was obtained in upload to the planning computer. There, on each axial slice, I contoured the prostate gland. Then, using three-dimensional radiation planning tools I reconstructed the prostate in view of the structures from the transperineal needle pathway to assess for possible pubic arch interference. In doing so, I did not appreciate any pubic arch interference. Also, the patient's prostate volume was estimated based on the drawn structure. The volume was 51 cc.  Given the pubic arch appearance and prostate volume, patient remains a good candidate to proceed with prostate seed implant. Today, he freely provided informed written consent to proceed.    PLAN: The patient will undergo prostate seed implant.   ________________________________  Sheral Apley. Tammi Klippel, M.D.

## 2013-10-05 ENCOUNTER — Telehealth: Payer: Self-pay | Admitting: *Deleted

## 2013-10-05 NOTE — Telephone Encounter (Signed)
CALLED PATIENT TO INFORM THAT IMPLANT HAS BEEN MOVED TO MARCH 6 AT 9:30 AM, SPOKE WITH PATIENT AND HE IS AWARE OF THE CHANGE

## 2013-11-05 ENCOUNTER — Telehealth: Payer: Self-pay | Admitting: *Deleted

## 2013-11-05 NOTE — Telephone Encounter (Signed)
CALLED PATIENT TO REMIND OF APPT FOR (BLOOD WORK) FOR 11-06-13, LVM FOR A RETURN CALL

## 2013-11-06 ENCOUNTER — Encounter (HOSPITAL_BASED_OUTPATIENT_CLINIC_OR_DEPARTMENT_OTHER): Payer: Self-pay | Admitting: *Deleted

## 2013-11-10 ENCOUNTER — Telehealth: Payer: Self-pay | Admitting: *Deleted

## 2013-11-10 NOTE — Telephone Encounter (Signed)
CALLED PATIENT TO ASK A QUESTION, LVM FOR A RETURN CALL 

## 2013-11-11 ENCOUNTER — Encounter (HOSPITAL_BASED_OUTPATIENT_CLINIC_OR_DEPARTMENT_OTHER): Payer: Self-pay | Admitting: *Deleted

## 2013-11-11 NOTE — Progress Notes (Signed)
NPO AFTER MN. ARRIVE AT 0700. CURRENT EKG AND CXR IN EPIC AND CHART. PT GETTING LAB WORK DONE 11-12-2013. WILL DO FLEET ENEMA AM DOS. ALSO, WILL HALF LANTUS DOSE HS BEFORE DOS TO 9 UNITS.

## 2013-11-12 ENCOUNTER — Telehealth: Payer: Self-pay | Admitting: *Deleted

## 2013-11-12 LAB — COMPREHENSIVE METABOLIC PANEL
ALT: 17 U/L (ref 0–53)
AST: 19 U/L (ref 0–37)
Albumin: 3.4 g/dL — ABNORMAL LOW (ref 3.5–5.2)
Alkaline Phosphatase: 63 U/L (ref 39–117)
BUN: 17 mg/dL (ref 6–23)
CO2: 25 mEq/L (ref 19–32)
Calcium: 9.8 mg/dL (ref 8.4–10.5)
Chloride: 106 mEq/L (ref 96–112)
Creatinine, Ser: 1.27 mg/dL (ref 0.50–1.35)
GFR calc Af Amer: 69 mL/min — ABNORMAL LOW (ref >90)
GFR calc non Af Amer: 60 mL/min — ABNORMAL LOW (ref >90)
Glucose, Bld: 153 mg/dL — ABNORMAL HIGH (ref 70–99)
Potassium: 4.6 mEq/L (ref 3.7–5.3)
Sodium: 143 mEq/L (ref 137–147)
Total Bilirubin: 0.3 mg/dL (ref 0.3–1.2)
Total Protein: 7 g/dL (ref 6.0–8.3)

## 2013-11-12 LAB — CBC
HCT: 32.5 % — ABNORMAL LOW (ref 39.0–52.0)
Hemoglobin: 10.2 g/dL — ABNORMAL LOW (ref 13.0–17.0)
MCH: 19.2 pg — ABNORMAL LOW (ref 26.0–34.0)
MCHC: 31.4 g/dL (ref 30.0–36.0)
MCV: 61.1 fL — ABNORMAL LOW (ref 78.0–100.0)
Platelets: 133 10*3/uL — ABNORMAL LOW (ref 150–400)
RBC: 5.32 MIL/uL (ref 4.22–5.81)
RDW: 17.8 % — ABNORMAL HIGH (ref 11.5–15.5)
WBC: 5.8 10*3/uL (ref 4.0–10.5)

## 2013-11-12 LAB — APTT: aPTT: 26 seconds (ref 24–37)

## 2013-11-12 LAB — PROTIME-INR
INR: 0.98 (ref 0.00–1.49)
Prothrombin Time: 12.8 seconds (ref 11.6–15.2)

## 2013-11-12 NOTE — Telephone Encounter (Signed)
CALLED PATIENT TO REMIND OF PROCEDURE FOR 11-13-13, LVM FOR A RETURN CALL

## 2013-11-13 ENCOUNTER — Encounter (HOSPITAL_BASED_OUTPATIENT_CLINIC_OR_DEPARTMENT_OTHER)
Admission: RE | Disposition: A | Discharge status: Home / Self Care | Payer: Self-pay | Source: Ambulatory Visit | Attending: Urology

## 2013-11-13 ENCOUNTER — Encounter (HOSPITAL_BASED_OUTPATIENT_CLINIC_OR_DEPARTMENT_OTHER): Payer: Medicare HMO | Admitting: Anesthesiology

## 2013-11-13 ENCOUNTER — Ambulatory Visit (HOSPITAL_COMMUNITY): Payer: Medicare HMO

## 2013-11-13 ENCOUNTER — Ambulatory Visit (HOSPITAL_BASED_OUTPATIENT_CLINIC_OR_DEPARTMENT_OTHER): Payer: Medicare HMO | Admitting: Anesthesiology

## 2013-11-13 ENCOUNTER — Encounter (HOSPITAL_BASED_OUTPATIENT_CLINIC_OR_DEPARTMENT_OTHER): Payer: Self-pay

## 2013-11-13 ENCOUNTER — Ambulatory Visit (HOSPITAL_BASED_OUTPATIENT_CLINIC_OR_DEPARTMENT_OTHER)
Admission: RE | Admit: 2013-11-13 | Discharge: 2013-11-13 | Disposition: A | Discharge status: Home / Self Care | Payer: Medicare HMO | Source: Ambulatory Visit | Attending: Urology | Admitting: Urology

## 2013-11-13 DIAGNOSIS — N400 Enlarged prostate without lower urinary tract symptoms: Secondary | ICD-10-CM | POA: Insufficient documentation

## 2013-11-13 DIAGNOSIS — Z79899 Other long term (current) drug therapy: Secondary | ICD-10-CM | POA: Insufficient documentation

## 2013-11-13 DIAGNOSIS — Z794 Long term (current) use of insulin: Secondary | ICD-10-CM | POA: Insufficient documentation

## 2013-11-13 DIAGNOSIS — E119 Type 2 diabetes mellitus without complications: Secondary | ICD-10-CM | POA: Insufficient documentation

## 2013-11-13 DIAGNOSIS — I10 Essential (primary) hypertension: Secondary | ICD-10-CM | POA: Insufficient documentation

## 2013-11-13 DIAGNOSIS — E7800 Pure hypercholesterolemia, unspecified: Secondary | ICD-10-CM | POA: Insufficient documentation

## 2013-11-13 DIAGNOSIS — C61 Malignant neoplasm of prostate: Secondary | ICD-10-CM | POA: Diagnosis present

## 2013-11-13 DIAGNOSIS — E291 Testicular hypofunction: Secondary | ICD-10-CM | POA: Insufficient documentation

## 2013-11-13 HISTORY — DX: Benign prostatic hyperplasia without lower urinary tract symptoms: N40.0

## 2013-11-13 HISTORY — DX: Type 2 diabetes mellitus without complications: E11.9

## 2013-11-13 HISTORY — DX: Long term (current) use of insulin: Z79.4

## 2013-11-13 HISTORY — DX: Personal history of other diseases of the nervous system and sense organs: Z86.69

## 2013-11-13 HISTORY — PX: RADIOACTIVE SEED IMPLANT: SHX5150

## 2013-11-13 HISTORY — DX: Pain in right shoulder: M25.511

## 2013-11-13 HISTORY — DX: Unspecified hearing loss, bilateral: H91.93

## 2013-11-13 HISTORY — DX: Presence of spectacles and contact lenses: Z97.3

## 2013-11-13 HISTORY — DX: Hyperlipidemia, unspecified: E78.5

## 2013-11-13 HISTORY — DX: Gastro-esophageal reflux disease without esophagitis: K21.9

## 2013-11-13 HISTORY — DX: Polyneuropathy, unspecified: G62.9

## 2013-11-13 HISTORY — DX: Personal history of other (healed) physical injury and trauma: Z87.828

## 2013-11-13 LAB — GLUCOSE, CAPILLARY
Glucose-Capillary: 86 mg/dL (ref 70–99)
Glucose-Capillary: 94 mg/dL (ref 70–99)

## 2013-11-13 SURGERY — INSERTION, RADIATION SOURCE, PROSTATE
Anesthesia: General | Site: Prostate

## 2013-11-13 MED ORDER — OXYCODONE HCL 5 MG/5ML PO SOLN
5.0000 mg | Freq: Once | ORAL | Status: DC | PRN
  Filled 2013-11-13: qty 5

## 2013-11-13 MED ORDER — FLEET ENEMA 7-19 GM/118ML RE ENEM
1.0000 | ENEMA | Freq: Once | RECTAL | Status: DC
  Filled 2013-11-13: qty 1

## 2013-11-13 MED ORDER — NEOSTIGMINE METHYLSULFATE 1 MG/ML IJ SOLN
INTRAMUSCULAR | Status: DC | PRN
Start: 2013-11-13 — End: 2013-11-13
  Administered 2013-11-13: 4 mg via INTRAVENOUS

## 2013-11-13 MED ORDER — CIPROFLOXACIN IN D5W 400 MG/200ML IV SOLN
400.0000 mg | INTRAVENOUS | Status: AC
  Administered 2013-11-13: 400 mg via INTRAVENOUS
  Filled 2013-11-13: qty 200

## 2013-11-13 MED ORDER — FENTANYL CITRATE 0.05 MG/ML IJ SOLN
INTRAMUSCULAR | Status: DC | PRN
  Administered 2013-11-13 (×4): 50 ug via INTRAVENOUS

## 2013-11-13 MED ORDER — MIDAZOLAM HCL 2 MG/2ML IJ SOLN
INTRAMUSCULAR | Status: AC
  Filled 2013-11-13: qty 2

## 2013-11-13 MED ORDER — LACTATED RINGERS IV SOLN
INTRAVENOUS | Status: DC
  Administered 2013-11-13 (×2): via INTRAVENOUS
  Filled 2013-11-13: qty 1000

## 2013-11-13 MED ORDER — STERILE WATER FOR IRRIGATION IR SOLN
Status: DC | PRN
  Administered 2013-11-13: 3000 mL via INTRAVESICAL

## 2013-11-13 MED ORDER — PROMETHAZINE HCL 25 MG/ML IJ SOLN
6.2500 mg | INTRAMUSCULAR | Status: DC | PRN
  Filled 2013-11-13: qty 1

## 2013-11-13 MED ORDER — CIPROFLOXACIN IN D5W 400 MG/200ML IV SOLN
INTRAVENOUS | Status: AC
  Filled 2013-11-13: qty 200

## 2013-11-13 MED ORDER — ACETAMINOPHEN 10 MG/ML IV SOLN
INTRAVENOUS | Status: DC | PRN
  Administered 2013-11-13: 1000 mg via INTRAVENOUS

## 2013-11-13 MED ORDER — ROCURONIUM BROMIDE 100 MG/10ML IV SOLN
INTRAVENOUS | Status: DC | PRN
  Administered 2013-11-13: 50 mg via INTRAVENOUS

## 2013-11-13 MED ORDER — FENTANYL CITRATE 0.05 MG/ML IJ SOLN
INTRAMUSCULAR | Status: AC
  Filled 2013-11-13: qty 6

## 2013-11-13 MED ORDER — GLYCOPYRROLATE 0.2 MG/ML IJ SOLN
INTRAMUSCULAR | Status: DC | PRN
  Administered 2013-11-13: .6 mg via INTRAVENOUS

## 2013-11-13 MED ORDER — HYDROCODONE-ACETAMINOPHEN 10-325 MG PO TABS: 1.0000 | ORAL_TABLET | ORAL | Status: DC | PRN

## 2013-11-13 MED ORDER — MEPERIDINE HCL 25 MG/ML IJ SOLN
6.2500 mg | INTRAMUSCULAR | Status: DC | PRN
  Filled 2013-11-13: qty 1

## 2013-11-13 MED ORDER — HYDROMORPHONE HCL PF 1 MG/ML IJ SOLN
0.2500 mg | INTRAMUSCULAR | Status: DC | PRN
  Filled 2013-11-13: qty 1

## 2013-11-13 MED ORDER — IOHEXOL 350 MG/ML SOLN
INTRAVENOUS | Status: DC | PRN
  Administered 2013-11-13: 7 mL

## 2013-11-13 MED ORDER — MIDAZOLAM HCL 5 MG/5ML IJ SOLN
INTRAMUSCULAR | Status: DC | PRN
  Administered 2013-11-13: 2 mg via INTRAVENOUS

## 2013-11-13 MED ORDER — LIDOCAINE HCL (CARDIAC) 20 MG/ML IV SOLN
INTRAVENOUS | Status: DC | PRN
  Administered 2013-11-13: 80 mg via INTRAVENOUS

## 2013-11-13 MED ORDER — ONDANSETRON HCL 4 MG/2ML IJ SOLN
INTRAMUSCULAR | Status: DC | PRN
  Administered 2013-11-13: 4 mg via INTRAVENOUS

## 2013-11-13 MED ORDER — OXYCODONE HCL 5 MG PO TABS
5.0000 mg | ORAL_TABLET | Freq: Once | ORAL | Status: DC | PRN
  Filled 2013-11-13: qty 1

## 2013-11-13 MED ORDER — PROPOFOL 10 MG/ML IV BOLUS
INTRAVENOUS | Status: DC | PRN
  Administered 2013-11-13: 200 mg via INTRAVENOUS

## 2013-11-13 MED ORDER — CIPROFLOXACIN HCL 500 MG PO TABS: 500.0000 mg | ORAL_TABLET | Freq: Two times a day (BID) | ORAL | Status: DC

## 2013-11-13 MED ORDER — STERILE WATER FOR IRRIGATION IR SOLN
Status: DC | PRN
  Administered 2013-11-13: 3 mL

## 2013-11-13 SURGICAL SUPPLY — 29 items
BAG URINE DRAINAGE (UROLOGICAL SUPPLIES) ×2 IMPLANT
BLADE SURG ROTATE 9660 (MISCELLANEOUS) ×2 IMPLANT
CATH FOLEY 2WAY SLVR  5CC 16FR (CATHETERS) ×2
CATH FOLEY 2WAY SLVR 5CC 16FR (CATHETERS) ×2 IMPLANT
CATH ROBINSON RED A/P 20FR (CATHETERS) ×2 IMPLANT
CLOTH BEACON ORANGE TIMEOUT ST (SAFETY) ×2 IMPLANT
COVER MAYO STAND STRL (DRAPES) ×2 IMPLANT
COVER TABLE BACK 60X90 (DRAPES) ×2 IMPLANT
DRSG TEGADERM 4X4.75 (GAUZE/BANDAGES/DRESSINGS) ×2 IMPLANT
DRSG TEGADERM 8X12 (GAUZE/BANDAGES/DRESSINGS) ×2 IMPLANT
GLOVE BIO SURGEON STRL SZ7.5 (GLOVE) IMPLANT
GLOVE BIO SURGEON STRL SZ8 (GLOVE) ×4 IMPLANT
GLOVE ECLIPSE 8.0 STRL XLNG CF (GLOVE) ×4 IMPLANT
GOWN STRL REIN XL XLG (GOWN DISPOSABLE) ×1 IMPLANT
GOWN STRL REUS W/ TWL LRG LVL3 (GOWN DISPOSABLE) IMPLANT
GOWN STRL REUS W/ TWL XL LVL3 (GOWN DISPOSABLE) IMPLANT
GOWN STRL REUS W/TWL LRG LVL3 (GOWN DISPOSABLE) ×2
GOWN STRL REUS W/TWL XL LVL3 (GOWN DISPOSABLE) ×2
GOWN XL W/COTTON TOWEL STD (GOWNS) ×1 IMPLANT
HOLDER FOLEY CATH W/STRAP (MISCELLANEOUS) ×2 IMPLANT
IV NS IRRIG 3000ML ARTHROMATIC (IV SOLUTION) IMPLANT
IV WATER IRR. 1000ML (IV SOLUTION) ×1 IMPLANT
PACK CYSTOSCOPY (CUSTOM PROCEDURE TRAY) ×2 IMPLANT
Radioactive seeds ×80 IMPLANT
SPONGE GAUZE 4X4 12PLY STER LF (GAUZE/BANDAGES/DRESSINGS) ×1 IMPLANT
SYRINGE 10CC LL (SYRINGE) ×2 IMPLANT
UNDERPAD 30X30 INCONTINENT (UNDERPADS AND DIAPERS) ×4 IMPLANT
WATER STERILE IRR 3000ML UROMA (IV SOLUTION) ×1 IMPLANT
WATER STERILE IRR 500ML POUR (IV SOLUTION) ×2 IMPLANT

## 2013-11-13 NOTE — Anesthesia Preprocedure Evaluation (Addendum)
Anesthesia Evaluation  Patient identified by MRN, date of birth, ID band Patient awake    Reviewed: Allergy & Precautions, H&P , NPO status , Patient's Chart, lab work & pertinent test results  Airway Mallampati: II TM Distance: >3 FB     Dental  (+) Dental Advisory Given   Pulmonary neg pulmonary ROS,  breath sounds clear to auscultation        Cardiovascular hypertension, Pt. on medications + Peripheral Vascular Disease Rhythm:Regular Rate:Normal     Neuro/Psych PSYCHIATRIC DISORDERS Depression negative neurological ROS     GI/Hepatic Neg liver ROS, GERD-  ,  Endo/Other  negative endocrine ROSdiabetes, Type 2, Insulin Dependent  Renal/GU Renal InsufficiencyRenal disease     Musculoskeletal negative musculoskeletal ROS (+)   Abdominal (+) + obese,   Peds  Hematology negative hematology ROS (+) anemia ,   Anesthesia Other Findings   Reproductive/Obstetrics                          Anesthesia Physical Anesthesia Plan  ASA: III  Anesthesia Plan: General   Post-op Pain Management:    Induction: Intravenous  Airway Management Planned: LMA  Additional Equipment:   Intra-op Plan:   Post-operative Plan: Extubation in OR  Informed Consent:   Dental advisory given  Plan Discussed with: CRNA  Anesthesia Plan Comments:         Anesthesia Quick Evaluation

## 2013-11-13 NOTE — Transfer of Care (Signed)
Immediate Anesthesia Transfer of Care Note  Patient: Gordon Young  Procedure(s) Performed: Procedure(s): RADIOACTIVE SEED IMPLANT (N/A)  Patient Location: PACU  Anesthesia Type:General  Level of Consciousness: awake, alert  and oriented  Airway & Oxygen Therapy: Patient Spontanous Breathing and Patient connected to nasal cannula oxygen  Post-op Assessment: Report given to PACU RN and Post -op Vital signs reviewed and stable  Post vital signs: Reviewed and stable  Complications: No apparent anesthesia complications

## 2013-11-13 NOTE — Op Note (Signed)
PATIENT:  Gordon Young  PRE-OPERATIVE DIAGNOSIS:  Adenocarcinoma of the prostate  POST-OPERATIVE DIAGNOSIS:  Same  PROCEDURE:  Procedure(s): 1. I-125 radioactive seed implantation 2. Cystoscopy  SURGEON:  Surgeon(s): Claybon Jabs  Radiation oncologist: Dr. Tyler Pita  ANESTHESIA:  General  EBL:  Minimal  DRAINS: 15 French Foley catheter  INDICATION: Gordon Young s a 63 year old male with biopsy-proven prostate cancer Gleason grade 3+3 = 6 in 3 cores.  Clinical stage T1c.  He has elected to proceed with treatment using I-125 seed implantation.  Description of procedure: After informed consent the patient was brought to the major OR, placed on the table and administered general anesthesia. He was then moved to the modified lithotomy position with his perineum perpendicular to the floor. His perineum and genitalia were then sterilely prepped. An official timeout was then performed. A 16 French Foley catheter was then placed in the bladder and filled with dilute contrast, a rectal tube was placed in the rectum and the transrectal ultrasound probe was placed in the rectum and affixed to the stand. He was then sterilely draped.  Real time ultrasonography was used along with the seed planning software spot-pro version 3.1-00. This was used to develop the seed plan including the number of needles as well as number of seeds required for complete and adequate coverage. Real-time ultrasonography was then used along with the previously developed plan and the Nucletron device to implant a total of 80 seeds using 24 needles. This proceeded without difficulty or complication.  A Foley catheter was then removed as well as the transrectal ultrasound probe and rectal probe. Flexible cystoscopy was then performed using the 17 French flexible scope which revealed a normal urethra throughout its length down to the sphincter which appeared intact. The prostatic urethra revealed bilobar hypertrophy but  no evidence of obstruction, seeds, spacers or lesions. The bladder was then entered and fully and systematically inspected. The ureteral orifices were noted to be of normal configuration and position. The mucosa revealed no evidence of tumors. There were also no stones identified within the bladder. I noted no seeds or spacers on the floor of the bladder and retroflexion of the scope revealed no seeds protruding from the base of the prostate.  The cystoscope was then removed and a new 92 French Foley catheter was then inserted and the balloon was filled with 10 cc of sterile water. This was connected to closed system drainage and the patient was awakened and taken to recovery room in stable and satisfactory condition. He tolerated procedure well and there were no intraoperative complications.

## 2013-11-13 NOTE — Procedures (Signed)
  Radiation Oncology         (336) [phone] ________________________________  Name: Gordon Young MRN: [id number]  Date: 11/13/2013  DOB: Jan 20, 1951       Prostate Seed Implant  SJ:[id number],[email or web address], MD  No ref. provider found  DIAGNOSIS: 63 y.o. gentleman with stage T1c adenocarcinoma of the prostate with a Gleason's score of 3+3 and a PSA of 6.5  PROCEDURE: Insertion of radioactive I-125 seeds into the prostate gland.  RADIATION DOSE: 145 Gy, definitive therapy.  TECHNIQUE: Gordon Young was brought to the operating room with the urologist. He was placed in the dorsolithotomy position. He was catheterized and a rectal tube was inserted. The perineum was shaved, prepped and draped. The ultrasound probe was then introduced into the rectum to see the prostate gland.  TREATMENT DEVICE: A needle grid was attached to the ultrasound probe stand and anchor needles were placed.  3D PLANNING: The prostate was imaged in 3D using a sagittal sweep of the prostate probe. These images were transferred to the planning computer. There, the prostate, urethra and rectum were defined on each axial reconstructed image. Then, the software created an optimized plan and a few seed positions were adjusted. The quality of the plan was evaluated in terms of dose volume histograms and isodose reconstructions.  Then the accepted plan was uploaded to the seed Selectron afterloading unit.  SPECIAL TREATMENT PROCEDURE/SUPERVISION AND HANDLING: The Nucletron FIRST system was used to place the needles under sagittal guidance. A total of 26 needles were used to deposit 80 seeds in the prostate gland. The individual seed activity was 0.539 mCi for a total implant activity of 43.12 mCi.  COMPLEX SIMULATION: At the end of the procedure, an anterior radiograph of the pelvis was obtained to document seed positioning and count. Cystoscopy was performed to check the urethra and bladder.  MICRODOSIMETRY: At the end of the procedure,  the patient was emitting 0.176 mrem/hr at 1 meter. Accordingly, he was considered safe for hospital discharge.  PLAN: The patient will return to the radiation oncology clinic for post implant CT dosimetry in three weeks.   ________________________________  Sheral Apley Tammi Klippel, M.D.

## 2013-11-13 NOTE — H&P (Signed)
Gordon Young is a 63 year old male patient with prostate cancer who has elected to proceed with radioactive seed implantation.   History of Present Illness    Adenocarcinoma of the prostate: He was found to have an elevated PSA with a DRE that revealed prostatic enlargement only.  PSAs:  11/03/12 - 5.3/24.3%  04/06/16 - 6.5/22.3%   TRUS/BX 05/26/13: Prostate volume - 72 cc  Pathology: Gleason 3+3 = 6 in 3 cores with 5% or less involvement of each core.  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 He has tried all 3 of the oral agents and these have not been effective for him. A serum testosterone was checked in 5/10 and was found to be low at 172. I prescribed AndroGel for testosterone replacement therapy and recommended he return for recheck of his testosterone level but he failed to return for followup. A repeat serum testosterone in 10/10 was again found to be low at 171.    Testosterone level 04/06/13 - 251     Interval history: He was seen after seeing Dr. Tammi Klippel and it was felt, due to the size of his prostate, that a 3 month course of 5 alpha reductase inhibitor therapy would be helpful in order to diminish the size of his prostate. He does have some outlet obstructive symptoms as well however they are minimal. He said he does get up twice at night. He was therefore placed on finasteride and has remained on that now for 3 months.     Past Medical History Problems  1. History of Anxiety (300.00) 2. History of Arthritis (V13.4) 3. History of depression (V11.8) 4. History of diabetes mellitus (V12.29) 5. History of hypercholesterolemia (V12.29) 6. History of hypertension (V12.59)  Surgical History Problems  1. History of Biopsy Of The Prostate Needle 2. History of Colonoscopy (Fiberoptic) 3. History of Ear Surgery 4.  History of Endoscopic Ultrasound Upper Gastrointestinal Gastric 5. History of Procedures Of The Head  Current Meds 1. Atorvastatin Calcium 40 MG Oral Tablet;  Therapy: 03Aug2014 to Recorded 2. Ibuprofen TABS; prn;  Therapy: (Recorded:04Nov2014) to Recorded 3. Lantus 100 UNIT/ML Subcutaneous Solution;  Therapy: 03Apr2014 to Recorded 4. Lisinopril 10 MG Oral Tablet;  Therapy: 29Aug2014 to Recorded 5. NovoLOG Mix 70/30 FlexPen (70-30) 100 UNIT/ML Subcutaneous Suspension  Pen-injector;  Therapy: [phone] to Recorded  Allergies Medication  1. No Known Drug Allergies  Family History Problems  1. Family history of Epilepsy And Recurrent Seizures (V17.2) : Father 2. Family history of Family Health Status Number Of Children   5 children 3. Family history of Stroke Syndrome (V17.1) : Father  Social History Problems  1. Denied: History of Alcohol Use 2. Denied: History of Caffeine Use 3. Denied: History of Former Smoker 4. Marital History - Currently Married 5. Never A Smoker    Vitals Vital Signs  Blood Pressure: 160 / 97 Temperature: 97.1 F Heart Rate: 89  Review of Systems Genitourinary, constitutional, skin, eye, otolaryngeal, hematologic/lymphatic, cardiovascular, pulmonary, endocrine, musculoskeletal, gastrointestinal, neurological and psychiatric system(s) were reviewed and pertinent findings if present are noted.  Genitourinary: urinary frequency, feelings of urinary urgency, nocturia, difficulty starting the urinary stream, weak urinary stream, urinary stream starts and stops and erectile dysfunction.  Gastrointestinal: diarrhea.  Constitutional: feeling tired (fatigue).  Integumentary: skin rash/lesion and pruritus.  Eyes: blurred vision.  ENT:  sinus problems.  Respiratory: shortness of breath and cough.  Endocrine: polydipsia.  Psychiatric: anxiety and depression.    Physical Exam Constitutional: Well nourished and well developed. No acute distress.  ENT:. The  ears and nose are normal in appearance.  Neck: The appearance of the neck is normal and no neck mass is present.  Pulmonary: No respiratory distress and normal respiratory rhythm and effort.  Cardiovascular: Heart rate and rhythm are normal. No peripheral edema.  Abdomen: The abdomen is soft and nontender. No masses are palpated. No CVA tenderness. No hernias are palpable. No hepatosplenomegaly noted.  Genitourinary: Examination of the penis demonstrates no discharge, no masses, no lesions and a normal meatus. The penis is uncircumcised. The scrotum is without lesions. The right epididymis is palpably normal and non-tender. The left epididymis is palpably normal and non-tender. The right testis is non-tender and without masses. The left testis is non-tender and without masses. Rectal exam demonstrates normal sphincter tone, no tenderness and no masses. Estimated prostate size is 2+. The prostate has no nodularity and is not tender. The left seminal vesicle is nonpalpable. The right seminal vesicle is nonpalpable. The perineum is normal on inspection.  Lymphatics: The femoral and inguinal nodes are not enlarged or tender.  Skin: Normal skin turgor, no visible rash and no visible skin lesions.  Neuro/Psych:. Mood and affect are appropriate.     Results/Data Urine  COLOR YELLOW  APPEARANCE CLEAR  SPECIFIC GRAVITY 1.025  pH 6.0  GLUCOSE > 1000 mg/dL BILIRUBIN NEG  KETONE NEG mg/dL BLOOD SMALL  PROTEIN 30 mg/dL UROBILINOGEN 0.2 mg/dL NITRITE NEG  LEUKOCYTE ESTERASE NEG  SQUAMOUS EPITHELIAL/HPF RARE  WBC 0-2 WBC/hpf RBC 0-2 RBC/hpf BACTERIA RARE  CRYSTALS NONE SEEN  CASTS NONE SEEN   Assessment   I have discussed with the patient the fact that his prostate volume at 72 cc is larger than the 60 cc cut off and used for patient's undergoing radioactive seed implantation. We discussed the reasoning behind this due to the anatomy of the bony pelvis and difficulty accessing all the prostate  transperineally. I went over the rationale behind using finasteride for its ability to shrink the prostate over time. We discussed the side effects of the medication. My recommendation was that he remain on this for 3 months and then will be scheduled for his seed implantation. He is now been on his 5 alpha reductase inhibitor for 3 months.     Plan He presents for radioactive seed implantation.

## 2013-11-13 NOTE — Anesthesia Postprocedure Evaluation (Signed)
Anesthesia Post Note  Patient: Gordon Young  Procedure(s) Performed: Procedure(s) (LRB): RADIOACTIVE SEED IMPLANT (N/A)  Anesthesia type: General  Patient location: PACU  Post pain: Pain level controlled  Post assessment: Post-op Vital signs reviewed  Last Vitals: BP 170/106  Pulse 82  Temp(Src) 37.1 C (Oral)  Resp 7  Ht 5\' 7"  (1.702 m)  Wt 212 lb (96.163 kg)  BMI 33.20 kg/m2  SpO2 98%  Post vital signs: Reviewed  Level of consciousness: sedated  Complications: No apparent anesthesia complications

## 2013-11-13 NOTE — Discharge Instructions (Addendum)
DISCHARGE INSTRUCTIONS FOR PROSTATE SEED IMPLANTATION ° °Removal of catheter °Remove the foley catheter after 24 hours ( day after the procedure).can be done easily by cutting the side port of the catheter, whichallow the balloon to deflate.  You will see 1-2 teaspoons of clear water as the balloon deflates and then the catheter can be slid out without difficulty. ° ° °     Cut here ° °Antibiotics °You may be given a prescription for an antibiotic to take when you arrive home. If so, be sure to take every tablet in the bottle, even if you are feeling better before the prescription is finished. If you begin itching, notice a rash or start to swell on your trunk, arms, legs and/or throat, immediately stop taking the antibiotic and call your Urologist. °Diet °Resume your usual diet when you return home. To keep your bowels moving easily and softly, drink prune, apple and cranberry juice at room temperature. You may also take a stool softener, such as Colace, which is available without prescription at local pharmacies. °Daily activities °  No driving or heavy lifting for at least two days after the implant. °  No bike riding, horseback riding or riding lawn mowers for the first month after the implant. °  Any strenuous physical activity should be approved by your doctor before you resume it. °Sexual relations °You may resume sexual relations two weeks after the procedure. A condom should be used for the first two weeks. Your semen may be dark brown or black; this is normal and is related bleeding that may have occurred during the implant. °Postoperative swelling °Expect swelling and bruising of the scrotum and perineum (the area between the scrotum and anus). Both the swelling and the bruising should resolve in l or 2 weeks. Ice packs and over- the-counter medications such as Tylenol, Advil or Aleve may lessen your discomfort. °Postoperative urination °Most men experience burning on urination and/or urinary frequency.  If this becomes bothersome, contact your Urologist.  Medication can be prescribed to relieve these problems.  It is normal to have some blood in your urine for a few days after the implant. °Special instructions related to the seeds °It is unlikely that you will pass an Iodine-125 seed in your urine. The seeds are silver in color and are about as large as a grain of rice. If you pass a seed, do not handle it with your fingers. Use a spoon to place it in an envelope or jar in place this in base occluded area such as the garage or basement for return to the radiation clinic at your convenience. ° °Contact your doctor for °  Temperature greater than 101 F °  Increasing pain °  Inability to urinate °Follow-up ° You should have follow up with your urologist and radiation oncologist about 3 weeks after the procedure. °General information regarding Iodine seeds °  Iodine-125 is a low energy radioactive material. It is not deeply penetrating and loses energy at short distances. Your prostate will absorb the radiation. Objects that are touched or used by the patient do not become radioactive. °  Body wastes (urine and stool) or body fluids (saliva, tears, semen or blood) are not radioactive. °  The Nuclear Regulatory Commission (NRC) has determined that no radiation precautions are needed for patients undergoing Iodine-125 seed implantation. The NRC states that such patients do not present a risk to the people around them, including young children and pregnant women. However, in keeping with the general principle   that radiation exposure should be kept as low reasonably possible, we suggest the following:   Children and pets should not sit on the patient's lap for the first two (2) weeks after the implant.   Pregnant (or possibly pregnant) women should avoid prolonged, close contact with the patient for the first two (2) weeks after the implant.   A distance of three (3) feet is acceptable. At a distance of three (3)  feet, there is no limit to the length of time anyone can be with the  Post Anesthesia Home Care Instructions  Activity: Get plenty of rest for the remainder of the day. A responsible adult should stay with you for 24 hours following the procedure.  For the next 24 hours, DO NOT: -Drive a car -Paediatric nurse -Drink alcoholic beverages -Take any medication unless instructed by your physician -Make any legal decisions or sign important papers.  Meals: Start with liquid foods such as gelatin or soup. Progress to regular foods as tolerated. Avoid greasy, spicy, heavy foods. If nausea and/or vomiting occur, drink only clear liquids until the nausea and/or vomiting subsides. Call your physician if vomiting continues.  Special Instructions/Symptoms: Your throat may feel dry or sore from the anesthesia or the breathing tube placed in your throat during surgery. If this causes discomfort, gargle with warm salt water. The discomfort should disappear within 24 hours.   patient.

## 2013-11-16 ENCOUNTER — Encounter (HOSPITAL_BASED_OUTPATIENT_CLINIC_OR_DEPARTMENT_OTHER): Payer: Self-pay | Admitting: Urology

## 2013-12-02 ENCOUNTER — Telehealth: Payer: Self-pay | Admitting: *Deleted

## 2013-12-02 NOTE — Telephone Encounter (Signed)
Called patient to remind of appts. For 12-03-13, lvm for a return call 

## 2013-12-02 NOTE — Telephone Encounter (Signed)
Called patient to remind of appts. For 12-03-13, lvm for a return call

## 2013-12-03 ENCOUNTER — Ambulatory Visit
Admission: RE | Admit: 2013-12-03 | Discharge: 2013-12-03 | Disposition: A | Discharge status: Home / Self Care | Payer: Medicare HMO | Source: Ambulatory Visit | Attending: Radiation Oncology | Admitting: Radiation Oncology

## 2013-12-03 ENCOUNTER — Ambulatory Visit
Admit: 2013-12-03 | Discharge: 2013-12-03 | Disposition: A | Discharge status: Home / Self Care | Payer: Medicare HMO | Attending: Radiation Oncology | Admitting: Radiation Oncology

## 2013-12-03 ENCOUNTER — Encounter: Payer: Self-pay | Admitting: Radiation Oncology

## 2013-12-03 ENCOUNTER — Ambulatory Visit: Payer: Medicare HMO | Admitting: Radiation Oncology

## 2013-12-03 DIAGNOSIS — C61 Malignant neoplasm of prostate: Secondary | ICD-10-CM

## 2013-12-03 NOTE — Progress Notes (Signed)
  Radiation Oncology         (336) [phone] ________________________________  Name: Gordon Young MRN: [id number]  Date: 12/03/2013  DOB: June 19, 1951  COMPLEX SIMULATION NOTE  NARRATIVE:  The patient was brought to the [facility] today following prostate seed implantation approximately one month ago.  Identity was confirmed.  All relevant records and images related to the planned course of therapy were reviewed.  Then, the patient was set-up supine.  CT images were obtained.  The CT images were loaded into the planning software.  Then the prostate and rectum were contoured.  Treatment planning then occurred.  The implanted iodine 125 seeds were identified by the physics staff for projection of radiation distribution  I have requested : 3D Simulation  I have requested a DVH of the following structures: Prostate and rectum.    ________________________________  Sheral Apley Tammi Klippel, M.D.

## 2013-12-03 NOTE — Progress Notes (Signed)
Flat affect and slow to process questions. Denies night sweats or weight loss noted. Reports blood in urine and stool. Denies blood in semen. Reports nocturia x 2. Reports urgency. Reports a steady uninterrupted urine stream. IPSS 9 today down from 13. Reports fatigue continues. Questioned, "are you all going to put the radiation in my seeds today." Upset about discomfort felt when HE removed his catheter.

## 2013-12-03 NOTE — Progress Notes (Signed)
Radiation Oncology         [phone] ________________________________  Name: Gordon Young MRN: [id number]  Date: 12/03/2013  DOB: May 18, 1951  Follow-Up Visit Note  CC: Mack Hook, MD  Claybon Jabs, MD  Diagnosis:   63 y.o. gentleman with stage T1c adenocarcinoma of the prostate with a Gleason's score of 3+3 and a PSA of 6.5  Interval Since Last Radiation:  3  weeks  Narrative:  The patient returns today for routine follow-up.  He is complaining of increased urinary frequency and urinary hesitation symptoms. He filled out a questionnaire regarding urinary function today providing and overall IPSS score of 9 characterizing his symptoms as moderate.  His pre-implant score was higher at 12. He denies any bowel symptoms.  ALLERGIES:  has No Known Allergies.  Meds: Current Outpatient Prescriptions  Medication Sig Dispense Refill  . ascorbic acid (VITAMIN C) 500 MG tablet Take 500 mg by mouth daily.      . calcium carbonate (TUMS - DOSED IN MG ELEMENTAL CALCIUM) 500 MG chewable tablet Chew 1 tablet by mouth as needed for indigestion or heartburn.      . ferrous sulfate 325 (65 FE) MG tablet Take 325 mg by mouth daily with breakfast.      . insulin aspart (NOVOLOG FLEXPEN) 100 UNIT/ML FlexPen Inject 15 Units into the skin 2 (two) times daily.      . insulin glargine (LANTUS) 100 UNIT/ML injection Inject 18 Units into the skin at bedtime.      Marland Kitchen lisinopril (PRINIVIL,ZESTRIL) 10 MG tablet Take 10 mg by mouth every evening.      . gabapentin (NEURONTIN) 300 MG capsule Take 300 mg by mouth 3 (three) times daily.      Marland Kitchen HYDROcodone-acetaminophen (NORCO) 10-325 MG per tablet Take 1-2 tablets by mouth every 4 (four) hours as needed for moderate pain. Maximum dose per 24 hours - 8 pills  30 tablet  0   No current facility-administered medications for this encounter.    Physical Findings: The patient is in no acute distress. Patient is alert and oriented.  vitals were not taken for  this visit..  No significant changes.  Lab Findings: Lab Results  Component Value Date   WBC 5.8 11/12/2013   HGB 10.2* 11/12/2013   HCT 32.5* 11/12/2013   MCV 61.1* 11/12/2013   PLT 133* 11/12/2013    Radiographic Findings:  Patient underwent CT imaging in our clinic for post implant dosimetry. The CT appears to demonstrate an adequate distribution of radioactive seeds throughout the prostate gland. There no seeds in her near the rectum. I suspect the final radiation plan and dosimetry will show appropriate coverage of the prostate gland.   Impression: The patient is recovering from the effects of radiation. His urinary symptoms should gradually improve over the next 4-6 months. We talked about this today. He is encouraged by his improvement already and is otherwise please with his outcome.   Plan: Today, I spent time talking to the patient about his prostate seed implant and resolving urinary symptoms. We also talked about long-term follow-up for prostate cancer following seed implant. He understands that ongoing PSA determinations and digital rectal exams will help perform surveillance to rule out disease recurrence. He understands what to expect with his PSA measures. Patient was also educated today about some of the long-term effects from radiation including a small risk for rectal bleeding and possibly erectile dysfunction. We talked about some of the general management approaches to these potential complications.  However, I did encourage the patient to contact our office or return at any point if he has questions or concerns related to his previous radiation and prostate cancer.  _____________________________________  Sheral Apley. Tammi Klippel, M.D.

## 2014-02-15 ENCOUNTER — Encounter: Payer: Self-pay | Admitting: Radiation Oncology

## 2014-02-15 ENCOUNTER — Ambulatory Visit
Admission: RE | Admit: 2014-02-15 | Discharge: 2014-02-15 | Disposition: A | Discharge status: Home / Self Care | Payer: Medicare HMO | Source: Ambulatory Visit | Attending: Radiation Oncology | Admitting: Radiation Oncology

## 2014-02-15 DIAGNOSIS — C61 Malignant neoplasm of prostate: Secondary | ICD-10-CM | POA: Insufficient documentation

## 2014-02-15 DIAGNOSIS — Z510 Encounter for antineoplastic radiation therapy: Secondary | ICD-10-CM | POA: Insufficient documentation

## 2014-02-22 ENCOUNTER — Encounter (HOSPITAL_COMMUNITY): Payer: Self-pay | Admitting: Emergency Medicine

## 2014-02-22 ENCOUNTER — Observation Stay (HOSPITAL_COMMUNITY)
Admission: EM | Admit: 2014-02-22 | Discharge: 2014-02-24 | Disposition: A | Discharge status: Home Health | Payer: Medicare HMO | Attending: Internal Medicine | Admitting: Internal Medicine

## 2014-02-22 DIAGNOSIS — F4310 Post-traumatic stress disorder, unspecified: Secondary | ICD-10-CM

## 2014-02-22 DIAGNOSIS — N529 Male erectile dysfunction, unspecified: Secondary | ICD-10-CM | POA: Insufficient documentation

## 2014-02-22 DIAGNOSIS — Z87820 Personal history of traumatic brain injury: Secondary | ICD-10-CM | POA: Insufficient documentation

## 2014-02-22 DIAGNOSIS — Z91199 Patient's noncompliance with other medical treatment and regimen due to unspecified reason: Secondary | ICD-10-CM

## 2014-02-22 DIAGNOSIS — S52509A Unspecified fracture of the lower end of unspecified radius, initial encounter for closed fracture: Secondary | ICD-10-CM

## 2014-02-22 DIAGNOSIS — I10 Essential (primary) hypertension: Secondary | ICD-10-CM

## 2014-02-22 DIAGNOSIS — S01501A Unspecified open wound of lip, initial encounter: Secondary | ICD-10-CM | POA: Insufficient documentation

## 2014-02-22 DIAGNOSIS — K5900 Constipation, unspecified: Secondary | ICD-10-CM

## 2014-02-22 DIAGNOSIS — F329 Major depressive disorder, single episode, unspecified: Secondary | ICD-10-CM

## 2014-02-22 DIAGNOSIS — H9190 Unspecified hearing loss, unspecified ear: Secondary | ICD-10-CM | POA: Insufficient documentation

## 2014-02-22 DIAGNOSIS — S52599A Other fractures of lower end of unspecified radius, initial encounter for closed fracture: Principal | ICD-10-CM | POA: Insufficient documentation

## 2014-02-22 DIAGNOSIS — F528 Other sexual dysfunction not due to a substance or known physiological condition: Secondary | ICD-10-CM

## 2014-02-22 DIAGNOSIS — S52502A Unspecified fracture of the lower end of left radius, initial encounter for closed fracture: Secondary | ICD-10-CM

## 2014-02-22 DIAGNOSIS — F3289 Other specified depressive episodes: Secondary | ICD-10-CM

## 2014-02-22 DIAGNOSIS — E291 Testicular hypofunction: Secondary | ICD-10-CM | POA: Insufficient documentation

## 2014-02-22 DIAGNOSIS — K219 Gastro-esophageal reflux disease without esophagitis: Secondary | ICD-10-CM | POA: Insufficient documentation

## 2014-02-22 DIAGNOSIS — Y92410 Unspecified street and highway as the place of occurrence of the external cause: Secondary | ICD-10-CM | POA: Insufficient documentation

## 2014-02-22 DIAGNOSIS — IMO0002 Reserved for concepts with insufficient information to code with codable children: Secondary | ICD-10-CM | POA: Insufficient documentation

## 2014-02-22 DIAGNOSIS — E1159 Type 2 diabetes mellitus with other circulatory complications: Secondary | ICD-10-CM

## 2014-02-22 DIAGNOSIS — E785 Hyperlipidemia, unspecified: Secondary | ICD-10-CM | POA: Insufficient documentation

## 2014-02-22 DIAGNOSIS — S0081XA Abrasion of other part of head, initial encounter: Secondary | ICD-10-CM

## 2014-02-22 DIAGNOSIS — M545 Low back pain, unspecified: Secondary | ICD-10-CM

## 2014-02-22 DIAGNOSIS — S0003XA Contusion of scalp, initial encounter: Secondary | ICD-10-CM | POA: Insufficient documentation

## 2014-02-22 DIAGNOSIS — E11319 Type 2 diabetes mellitus with unspecified diabetic retinopathy without macular edema: Secondary | ICD-10-CM

## 2014-02-22 DIAGNOSIS — C61 Malignant neoplasm of prostate: Secondary | ICD-10-CM

## 2014-02-22 DIAGNOSIS — E119 Type 2 diabetes mellitus without complications: Secondary | ICD-10-CM | POA: Diagnosis present

## 2014-02-22 DIAGNOSIS — Z794 Long term (current) use of insulin: Secondary | ICD-10-CM

## 2014-02-22 DIAGNOSIS — S1093XA Contusion of unspecified part of neck, initial encounter: Secondary | ICD-10-CM

## 2014-02-22 DIAGNOSIS — Z8679 Personal history of other diseases of the circulatory system: Secondary | ICD-10-CM

## 2014-02-22 DIAGNOSIS — N400 Enlarged prostate without lower urinary tract symptoms: Secondary | ICD-10-CM | POA: Insufficient documentation

## 2014-02-22 DIAGNOSIS — E782 Mixed hyperlipidemia: Secondary | ICD-10-CM

## 2014-02-22 DIAGNOSIS — R413 Other amnesia: Secondary | ICD-10-CM

## 2014-02-22 DIAGNOSIS — G4700 Insomnia, unspecified: Secondary | ICD-10-CM

## 2014-02-22 DIAGNOSIS — S022XXA Fracture of nasal bones, initial encounter for closed fracture: Secondary | ICD-10-CM | POA: Insufficient documentation

## 2014-02-22 DIAGNOSIS — G609 Hereditary and idiopathic neuropathy, unspecified: Secondary | ICD-10-CM | POA: Insufficient documentation

## 2014-02-22 DIAGNOSIS — Z9119 Patient's noncompliance with other medical treatment and regimen: Secondary | ICD-10-CM

## 2014-02-22 DIAGNOSIS — R197 Diarrhea, unspecified: Secondary | ICD-10-CM

## 2014-02-22 DIAGNOSIS — S5000XA Contusion of unspecified elbow, initial encounter: Secondary | ICD-10-CM | POA: Insufficient documentation

## 2014-02-22 DIAGNOSIS — D539 Nutritional anemia, unspecified: Secondary | ICD-10-CM

## 2014-02-22 DIAGNOSIS — S01511A Laceration without foreign body of lip, initial encounter: Secondary | ICD-10-CM

## 2014-02-22 DIAGNOSIS — N189 Chronic kidney disease, unspecified: Secondary | ICD-10-CM

## 2014-02-22 DIAGNOSIS — E1149 Type 2 diabetes mellitus with other diabetic neurological complication: Secondary | ICD-10-CM

## 2014-02-22 DIAGNOSIS — S0083XA Contusion of other part of head, initial encounter: Secondary | ICD-10-CM | POA: Insufficient documentation

## 2014-02-22 HISTORY — DX: Procedure and treatment not carried out because of patient's decision for reasons of belief and group pressure: Z53.1

## 2014-02-22 HISTORY — DX: Reserved for inherently not codable concepts without codable children: IMO0001

## 2014-02-22 MED ORDER — FENTANYL CITRATE 0.05 MG/ML IJ SOLN
100.0000 ug | Freq: Once | INTRAMUSCULAR | Status: AC
  Administered 2014-02-22: 100 ug via INTRAVENOUS
  Filled 2014-02-22: qty 2

## 2014-02-22 NOTE — ED Notes (Addendum)
Tenderness noted to L wrist and knee over patella.
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
## 2014-02-22 NOTE — Progress Notes (Signed)
  Radiation Oncology         [phone] ________________________________  Name: PAZ WINSETT MRN: [id number]  Date: 02/15/2014  DOB: [birth year]  Complex Isodose Planning Note Prostate Brachytherapy  Diagnosis: 63 y.o. gentleman with stage T1c adenocarcinoma of the prostate with a Gleason's score of 3+3 and a PSA of 6.5  Narrative: On a previous date, BRECKIN SAVANNAH returned following prostate seed implantation for post implant planning. He underwent CT scan complex simulation to delineate the three-dimensional structures of the pelvis and demonstrate the radiation distribution.  Since that time, the seed localization, and complex isodose planning with dose volume histograms have now been completed.  Results:   Prostate Coverage - The dose of radiation delivered to the 90% or more of the prostate gland (D90) was 121% of the prescription dose. This exceeds our goal of greater than 90%. Rectal Sparing - The volume of rectal tissue receiving the prescription dose or higher was 0.0 cc. This falls under our thresholds tolerance of 1.0 cc.  Impression: The prostate seed implant appears to show adequate target coverage and appropriate rectal sparing.  Plan:  The patient will continue to follow with urology for ongoing PSA determinations. I would anticipate a high likelihood for local tumor control with minimal risk for rectal morbidity.  ________________________________  Sheral Apley Tammi Klippel, M.D.

## 2014-02-22 NOTE — ED Notes (Signed)
Pt missing L bottom front tooth, pt L upper front tooth partially missing. Minor bleeding coming from gums

## 2014-02-22 NOTE — ED Notes (Signed)
Pt denies using ETOH or elicit drugs

## 2014-02-23 ENCOUNTER — Emergency Department (HOSPITAL_COMMUNITY): Payer: Medicare HMO

## 2014-02-23 ENCOUNTER — Encounter (HOSPITAL_COMMUNITY): Payer: Self-pay | Admitting: General Practice

## 2014-02-23 DIAGNOSIS — S62109A Fracture of unspecified carpal bone, unspecified wrist, initial encounter for closed fracture: Secondary | ICD-10-CM

## 2014-02-23 DIAGNOSIS — I10 Essential (primary) hypertension: Secondary | ICD-10-CM | POA: Diagnosis present

## 2014-02-23 DIAGNOSIS — S0180XA Unspecified open wound of other part of head, initial encounter: Secondary | ICD-10-CM

## 2014-02-23 DIAGNOSIS — S01501A Unspecified open wound of lip, initial encounter: Secondary | ICD-10-CM

## 2014-02-23 DIAGNOSIS — S52509A Unspecified fracture of the lower end of unspecified radius, initial encounter for closed fracture: Secondary | ICD-10-CM

## 2014-02-23 LAB — COMPREHENSIVE METABOLIC PANEL
ALBUMIN: 3.3 g/dL — AB (ref 3.5–5.2)
ALK PHOS: 63 U/L (ref 39–117)
ALT: 26 U/L (ref 0–53)
AST: 40 U/L — AB (ref 0–37)
BILIRUBIN TOTAL: 0.4 mg/dL (ref 0.3–1.2)
BUN: 15 mg/dL (ref 6–23)
CHLORIDE: 99 meq/L (ref 96–112)
CO2: 26 mEq/L (ref 19–32)
Calcium: 9.5 mg/dL (ref 8.4–10.5)
Creatinine, Ser: 1.17 mg/dL (ref 0.50–1.35)
GFR calc Af Amer: 76 mL/min — ABNORMAL LOW (ref >90)
GFR calc non Af Amer: 66 mL/min — ABNORMAL LOW (ref >90)
Glucose, Bld: 218 mg/dL — ABNORMAL HIGH (ref 70–99)
Potassium: 4 mEq/L (ref 3.7–5.3)
SODIUM: 140 meq/L (ref 137–147)
TOTAL PROTEIN: 7.1 g/dL (ref 6.0–8.3)

## 2014-02-23 LAB — CBC WITH DIFFERENTIAL/PLATELET
BASOS ABS: 0 10*3/uL (ref 0.0–0.1)
Basophils Relative: 0 % (ref 0–1)
EOS ABS: 0.2 10*3/uL (ref 0.0–0.7)
Eosinophils Relative: 3 % (ref 0–5)
HEMATOCRIT: 33.3 % — AB (ref 39.0–52.0)
HEMOGLOBIN: 10.6 g/dL — AB (ref 13.0–17.0)
Lymphocytes Relative: 34 % (ref 12–46)
Lymphs Abs: 2.1 10*3/uL (ref 0.7–4.0)
MCH: 19.6 pg — ABNORMAL LOW (ref 26.0–34.0)
MCHC: 31.8 g/dL (ref 30.0–36.0)
MCV: 61.4 fL — ABNORMAL LOW (ref 78.0–100.0)
Monocytes Absolute: 0.4 10*3/uL (ref 0.1–1.0)
Monocytes Relative: 7 % (ref 3–12)
NEUTROS ABS: 3.6 10*3/uL (ref 1.7–7.7)
NEUTROS PCT: 56 % (ref 43–77)
Platelets: 133 10*3/uL — ABNORMAL LOW (ref 150–400)
RBC: 5.42 MIL/uL (ref 4.22–5.81)
RDW: 17.8 % — AB (ref 11.5–15.5)
WBC: 6.3 10*3/uL (ref 4.0–10.5)

## 2014-02-23 LAB — RAPID URINE DRUG SCREEN, HOSP PERFORMED
Amphetamines: NOT DETECTED
Barbiturates: NOT DETECTED
Benzodiazepines: NOT DETECTED
Cocaine: NOT DETECTED
OPIATES: NOT DETECTED
Tetrahydrocannabinol: NOT DETECTED

## 2014-02-23 LAB — GLUCOSE, CAPILLARY
GLUCOSE-CAPILLARY: 318 mg/dL — AB (ref 70–99)
Glucose-Capillary: 529 mg/dL — ABNORMAL HIGH (ref 70–99)

## 2014-02-23 LAB — APTT: aPTT: 22 seconds — ABNORMAL LOW (ref 24–37)

## 2014-02-23 LAB — TROPONIN I

## 2014-02-23 LAB — CDS SEROLOGY

## 2014-02-23 LAB — PROTIME-INR
INR: 0.92 (ref 0.00–1.49)
Prothrombin Time: 12.2 seconds (ref 11.6–15.2)

## 2014-02-23 MED ORDER — ACETAMINOPHEN 650 MG RE SUPP: 650.0000 mg | Freq: Four times a day (QID) | RECTAL | Status: DC | PRN

## 2014-02-23 MED ORDER — INSULIN GLARGINE 100 UNIT/ML ~~LOC~~ SOLN
18.0000 [IU] | Freq: Every day | SUBCUTANEOUS | Status: DC
  Administered 2014-02-23: 18 [IU] via SUBCUTANEOUS
  Filled 2014-02-23 (×2): qty 0.18

## 2014-02-23 MED ORDER — MORPHINE SULFATE 2 MG/ML IJ SOLN: 2.0000 mg | INTRAMUSCULAR | Status: DC | PRN

## 2014-02-23 MED ORDER — ENOXAPARIN SODIUM 40 MG/0.4ML ~~LOC~~ SOLN
40.0000 mg | SUBCUTANEOUS | Status: DC
  Administered 2014-02-23: 40 mg via SUBCUTANEOUS
  Filled 2014-02-23 (×2): qty 0.4

## 2014-02-23 MED ORDER — ONDANSETRON HCL 4 MG/2ML IJ SOLN: 4.0000 mg | Freq: Four times a day (QID) | INTRAMUSCULAR | Status: DC | PRN

## 2014-02-23 MED ORDER — LISINOPRIL 40 MG PO TABS
40.0000 mg | ORAL_TABLET | Freq: Every day | ORAL | Status: DC
  Administered 2014-02-23 – 2014-02-24 (×2): 40 mg via ORAL
  Filled 2014-02-23 (×2): qty 1

## 2014-02-23 MED ORDER — HYDRALAZINE HCL 20 MG/ML IJ SOLN: 5.0000 mg | INTRAMUSCULAR | Status: DC | PRN

## 2014-02-23 MED ORDER — HYDRALAZINE HCL 20 MG/ML IJ SOLN
10.0000 mg | Freq: Once | INTRAMUSCULAR | Status: AC
  Administered 2014-02-23: 10 mg via INTRAVENOUS
  Filled 2014-02-23: qty 1

## 2014-02-23 MED ORDER — HYDRALAZINE HCL 20 MG/ML IJ SOLN
10.0000 mg | Freq: Four times a day (QID) | INTRAMUSCULAR | Status: DC | PRN
  Administered 2014-02-24: 10 mg via INTRAVENOUS
  Filled 2014-02-23: qty 1

## 2014-02-23 MED ORDER — INSULIN ASPART 100 UNIT/ML ~~LOC~~ SOLN
10.0000 [IU] | Freq: Three times a day (TID) | SUBCUTANEOUS | Status: DC
  Administered 2014-02-23: 10 [IU] via SUBCUTANEOUS

## 2014-02-23 MED ORDER — LABETALOL HCL 5 MG/ML IV SOLN
10.0000 mg | Freq: Once | INTRAVENOUS | Status: AC
  Administered 2014-02-23: 10 mg via INTRAVENOUS
  Filled 2014-02-23: qty 4

## 2014-02-23 MED ORDER — FERROUS SULFATE 325 (65 FE) MG PO TABS
325.0000 mg | ORAL_TABLET | Freq: Every day | ORAL | Status: DC
  Administered 2014-02-24: 325 mg via ORAL
  Filled 2014-02-23 (×2): qty 1

## 2014-02-23 MED ORDER — LABETALOL HCL 5 MG/ML IV SOLN
20.0000 mg | Freq: Once | INTRAVENOUS | Status: AC
  Administered 2014-02-23: 20 mg via INTRAVENOUS
  Filled 2014-02-23: qty 4

## 2014-02-23 MED ORDER — HYDROMORPHONE HCL PF 1 MG/ML IJ SOLN
1.0000 mg | Freq: Once | INTRAMUSCULAR | Status: AC
  Administered 2014-02-23: 1 mg via INTRAVENOUS
  Filled 2014-02-23: qty 1

## 2014-02-23 MED ORDER — NICARDIPINE HCL IN NACL 20-0.86 MG/200ML-% IV SOLN
5.0000 mg/h | Freq: Once | INTRAVENOUS | Status: AC
  Administered 2014-02-23: 5 mg/h via INTRAVENOUS
  Filled 2014-02-23: qty 200

## 2014-02-23 MED ORDER — INSULIN ASPART 100 UNIT/ML ~~LOC~~ SOLN
0.0000 [IU] | Freq: Three times a day (TID) | SUBCUTANEOUS | Status: DC
  Administered 2014-02-23: 15 [IU] via SUBCUTANEOUS

## 2014-02-23 MED ORDER — INSULIN GLARGINE 100 UNIT/ML ~~LOC~~ SOLN
18.0000 [IU] | Freq: Every day | SUBCUTANEOUS | Status: DC
  Filled 2014-02-23: qty 0.18

## 2014-02-23 MED ORDER — BACITRACIN ZINC 500 UNIT/GM EX OINT
TOPICAL_OINTMENT | Freq: Two times a day (BID) | CUTANEOUS | Status: DC
  Administered 2014-02-23: 17:00:00 via TOPICAL
  Administered 2014-02-24: 1 via TOPICAL
  Filled 2014-02-23: qty 28.35

## 2014-02-23 MED ORDER — INSULIN ASPART 100 UNIT/ML ~~LOC~~ SOLN
0.0000 [IU] | Freq: Every day | SUBCUTANEOUS | Status: DC
  Administered 2014-02-23: 4 [IU] via SUBCUTANEOUS

## 2014-02-23 MED ORDER — ONDANSETRON HCL 4 MG PO TABS: 4.0000 mg | ORAL_TABLET | Freq: Four times a day (QID) | ORAL | Status: DC | PRN

## 2014-02-23 MED ORDER — HYDROCODONE-ACETAMINOPHEN 10-325 MG PO TABS
1.0000 | ORAL_TABLET | ORAL | Status: DC | PRN
  Administered 2014-02-23 – 2014-02-24 (×3): 1 via ORAL
  Filled 2014-02-23 (×4): qty 1

## 2014-02-23 MED ORDER — ACETAMINOPHEN 325 MG PO TABS: 650.0000 mg | ORAL_TABLET | Freq: Four times a day (QID) | ORAL | Status: DC | PRN

## 2014-02-23 MED ORDER — METOPROLOL TARTRATE 25 MG PO TABS
25.0000 mg | ORAL_TABLET | Freq: Two times a day (BID) | ORAL | Status: DC
Start: 2014-02-23 — End: 2014-02-24
  Administered 2014-02-23 – 2014-02-24 (×3): 25 mg via ORAL
  Filled 2014-02-23 (×4): qty 1

## 2014-02-23 NOTE — Progress Notes (Signed)
Orthopedic Tech Progress Note Patient Details:  Gordon Young 08/18/1951 [id number]  Ortho Devices Type of Ortho Device: Arm sling;Wrist splint   Katheren Shams 02/23/2014, 2:06 AM
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
## 2014-02-23 NOTE — ED Notes (Signed)
MD at bedside. 
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
## 2014-02-23 NOTE — ED Notes (Signed)
Pt returned from restroom with steady gait

## 2014-02-23 NOTE — ED Notes (Signed)
Pt ambulated unassisted with a moderately unsteady gait to restroom.

## 2014-02-23 NOTE — ED Provider Notes (Signed)
The patient's blood pressure is now trending down, 170/70.  Patient's Cardene drip has stopped.  No new complaints.  Patient will be admitted for further evaluation and management.    Carmin Muskrat, MD 02/23/14 [phone]
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
## 2014-02-23 NOTE — Progress Notes (Signed)
Orthopedic Tech Progress Note Patient Details:  Gordon Young 1950-10-31 [id number]  Ortho Devices Type of Ortho Device: Ace wrap Ortho Device/Splint Interventions: Ordered;Application   Braulio Bosch 02/23/2014, 8:53 PM
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
## 2014-02-24 DIAGNOSIS — I10 Essential (primary) hypertension: Secondary | ICD-10-CM

## 2014-02-24 DIAGNOSIS — E1159 Type 2 diabetes mellitus with other circulatory complications: Secondary | ICD-10-CM

## 2014-02-24 DIAGNOSIS — S52599A Other fractures of lower end of unspecified radius, initial encounter for closed fracture: Secondary | ICD-10-CM

## 2014-02-24 LAB — CBC
HCT: 31.4 % — ABNORMAL LOW (ref 39.0–52.0)
HEMOGLOBIN: 10.2 g/dL — AB (ref 13.0–17.0)
MCH: 19.6 pg — AB (ref 26.0–34.0)
MCHC: 32.5 g/dL (ref 30.0–36.0)
MCV: 60.4 fL — ABNORMAL LOW (ref 78.0–100.0)
PLATELETS: 121 10*3/uL — AB (ref 150–400)
RBC: 5.2 MIL/uL (ref 4.22–5.81)
RDW: 17.5 % — ABNORMAL HIGH (ref 11.5–15.5)
WBC: 7.1 10*3/uL (ref 4.0–10.5)

## 2014-02-24 LAB — BASIC METABOLIC PANEL
BUN: 21 mg/dL (ref 6–23)
CALCIUM: 9 mg/dL (ref 8.4–10.5)
CO2: 22 mEq/L (ref 19–32)
Chloride: 100 mEq/L (ref 96–112)
Creatinine, Ser: 1.18 mg/dL (ref 0.50–1.35)
GFR calc Af Amer: 75 mL/min — ABNORMAL LOW (ref >90)
GFR, EST NON AFRICAN AMERICAN: 65 mL/min — AB (ref >90)
Glucose, Bld: 329 mg/dL — ABNORMAL HIGH (ref 70–99)
Potassium: 4.2 mEq/L (ref 3.7–5.3)
Sodium: 137 mEq/L (ref 137–147)

## 2014-02-24 LAB — GLUCOSE, CAPILLARY
GLUCOSE-CAPILLARY: 169 mg/dL — AB (ref 70–99)
Glucose-Capillary: 301 mg/dL — ABNORMAL HIGH (ref 70–99)

## 2014-02-24 LAB — HEMOGLOBIN A1C
Hgb A1c MFr Bld: 11.6 % — ABNORMAL HIGH (ref <5.7)
Mean Plasma Glucose: 286 mg/dL — ABNORMAL HIGH (ref <117)

## 2014-02-24 MED ORDER — INSULIN GLARGINE 100 UNIT/ML ~~LOC~~ SOLN
22.0000 [IU] | Freq: Every day | SUBCUTANEOUS | Status: DC
  Filled 2014-02-24: qty 0.22

## 2014-02-24 MED ORDER — LISINOPRIL 40 MG PO TABS: 40.0000 mg | ORAL_TABLET | Freq: Every day | ORAL | Status: AC

## 2014-02-24 MED ORDER — INSULIN ASPART 100 UNIT/ML ~~LOC~~ SOLN
0.0000 [IU] | Freq: Three times a day (TID) | SUBCUTANEOUS | Status: DC
  Administered 2014-02-24: 15 [IU] via SUBCUTANEOUS
  Administered 2014-02-24: 4 [IU] via SUBCUTANEOUS

## 2014-02-24 MED ORDER — INSULIN GLARGINE 100 UNIT/ML ~~LOC~~ SOLN: 22.0000 [IU] | Freq: Every day | SUBCUTANEOUS | Status: DC

## 2014-02-24 MED ORDER — HYDROCODONE-ACETAMINOPHEN 10-325 MG PO TABS: 1.0000 | ORAL_TABLET | Freq: Three times a day (TID) | ORAL | Status: DC | PRN

## 2014-02-24 MED ORDER — METOPROLOL TARTRATE 25 MG PO TABS: 25.0000 mg | ORAL_TABLET | Freq: Two times a day (BID) | ORAL | Status: DC

## 2014-02-24 MED ORDER — INSULIN ASPART 100 UNIT/ML FLEXPEN: 12.0000 [IU] | PEN_INJECTOR | Freq: Two times a day (BID) | SUBCUTANEOUS | Status: DC

## 2014-02-24 MED ORDER — BACITRACIN ZINC 500 UNIT/GM EX OINT: TOPICAL_OINTMENT | Freq: Two times a day (BID) | CUTANEOUS | Status: DC

## 2014-02-24 MED ORDER — LISINOPRIL 40 MG PO TABS: 10.0000 mg | ORAL_TABLET | Freq: Every evening | ORAL | Status: DC

## 2014-02-24 MED ORDER — INSULIN ASPART 100 UNIT/ML ~~LOC~~ SOLN
12.0000 [IU] | Freq: Three times a day (TID) | SUBCUTANEOUS | Status: DC
  Administered 2014-02-24 (×2): 12 [IU] via SUBCUTANEOUS
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
## 2014-02-24 NOTE — Discharge Instructions (Signed)
Please see Dr. Amil Amen at the [facility] walk in clinic on Friday 6/19 about your blood pressure, sugar, and stitches in your lip.  Dr. Amil Amen will have to prescribe any further pain medication if it is needed.  Take stool softeners while you are on narcotic pain medications to avoid constipation.  We recommend Colace 200 mg at bedtime.  Please see Dr. Amedeo Plenty (Orthopedic surgeon) regarding your wrist in 1 week.  Please take your insulin at prescribed.  Your dose has been increased.  Please take your blood pressure medication as prescribed.  Your BP was very high.  Home health care services have been ordered for you.

## 2014-02-24 NOTE — Progress Notes (Signed)
Patient ID: Gordon Young, male   DOB: 01-04-1951, 63 y.o.   MRN: [id number] Stable Will plan for conservative treatment to the fracture and follow-up in one week in our office No new changes [place] MD

## 2014-02-24 NOTE — Progress Notes (Signed)
Nsg Discharge Note  Admit Date:  02/22/2014 Discharge date: 02/24/2014   Gordon Young to be D/C'd Home per MD order.  AVS completed.  Copy for chart, and copy for patient signed, and dated. Patient/caregiver able to verbalize understanding.  Discharge Medication:   Medication List         ascorbic acid 500 MG tablet  Commonly known as:  VITAMIN C  Take 500 mg by mouth daily.     bacitracin ointment  Apply topically 2 (two) times daily.     calcium carbonate 500 MG chewable tablet  Commonly known as:  TUMS - dosed in mg elemental calcium  Chew 1 tablet by mouth as needed for indigestion or heartburn.     ferrous sulfate 325 (65 FE) MG tablet  Take 325 mg by mouth daily with breakfast.     HYDROcodone-acetaminophen 10-325 MG per tablet  Commonly known as:  NORCO  Take 1-2 tablets by mouth every 8 (eight) hours as needed for moderate pain. Maximum dose per 24 hours - 8 pills     insulin aspart 100 UNIT/ML FlexPen  Commonly known as:  NOVOLOG FLEXPEN  Inject 12 Units into the skin 2 (two) times daily.     insulin glargine 100 UNIT/ML injection  Commonly known as:  LANTUS  Inject 0.22 mLs (22 Units total) into the skin at bedtime.     lisinopril 40 MG tablet  Commonly known as:  PRINIVIL,ZESTRIL  Take 1 tablet (40 mg total) by mouth daily.     lisinopril 40 MG tablet  Commonly known as:  PRINIVIL,ZESTRIL  Take 0.5 tablets (20 mg total) by mouth every evening.     metoprolol tartrate 25 MG tablet  Commonly known as:  LOPRESSOR  Take 1 tablet (25 mg total) by mouth 2 (two) times daily.        Discharge Assessment: Filed Vitals:   02/24/14 1347  BP: 151/72  Pulse: 92  Temp: 98.1 F (36.7 C)  Resp: 16   Skin, may refer to doc flowsheets, abrasions noted to face.  IV catheter discontinued intact. Site without signs and symptoms of complications - no redness or edema noted at insertion site, patient denies c/o pain - only slight tenderness at site.  Dressing with  slight pressure applied.  D/c Instructions-Education: Discharge instructions given to patient/family with verbalized understanding. D/c education completed with patient/family including follow up instructions, medication list, d/c activities limitations if indicated, with other d/c instructions as indicated by MD - patient able to verbalize understanding, all questions fully answered. Patient instructed to return to ED, call 911, or call MD for any changes in condition.  Patient escorted via [place], and D/C home via private auto.  Dayle Points, RN 02/24/2014 3:25 PM

## 2014-02-24 NOTE — Care Management Note (Signed)
    Page 1 of 1   02/24/2014     2:28:58 PM CARE MANAGEMENT NOTE 02/24/2014  Patient:  Gordon Young, Gordon Young   Account Number:  [id number]  Date Initiated:  02/24/2014  Documentation initiated by:  Tomi Bamberger  Subjective/Objective Assessment:   dx htn  admit as observation     Action/Plan:   Anticipated DC Date:  02/24/2014   Anticipated DC Plan:  [place]  CM consult      [facility] Choice  HOME HEALTH   Choice offered to / List presented to:  C-1 Patient        [place] arranged  [place] RN  [place].   Status of service:  Completed, signed off Medicare Important Message given?   (If response is "NO", the following Medicare IM given date fields will be blank) Date Medicare IM given:   Date Additional Medicare IM given:    Discharge Disposition:  [place]  Per UR Regulation:  Reviewed for med. necessity/level of care/duration of stay  If discussed at [place] of Stay Meetings, dates discussed:    Comments:  02/24/14 [place], BSN [phone] patient is for dc today, he chose [facility], pt ,ot and social work , referral made to [facility] , Butch Penny notified.  Soc will begin 24-48 hrs post dc.
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
## 2014-02-24 NOTE — Progress Notes (Signed)
UR Completed Brenda Graves-Bigelow, RN,BSN [phone]  

## 2014-12-18 ENCOUNTER — Encounter (HOSPITAL_COMMUNITY): Payer: Self-pay | Admitting: Emergency Medicine

## 2014-12-18 ENCOUNTER — Emergency Department (HOSPITAL_COMMUNITY)
Admission: EM | Admit: 2014-12-18 | Discharge: 2014-12-19 | Disposition: A | Discharge status: Home / Self Care | Payer: Medicare HMO | Attending: Emergency Medicine | Admitting: Emergency Medicine

## 2014-12-18 DIAGNOSIS — H9193 Unspecified hearing loss, bilateral: Secondary | ICD-10-CM | POA: Diagnosis not present

## 2014-12-18 DIAGNOSIS — N134 Hydroureter: Secondary | ICD-10-CM | POA: Diagnosis not present

## 2014-12-18 DIAGNOSIS — E119 Type 2 diabetes mellitus without complications: Secondary | ICD-10-CM | POA: Insufficient documentation

## 2014-12-18 DIAGNOSIS — Z87828 Personal history of other (healed) physical injury and trauma: Secondary | ICD-10-CM | POA: Insufficient documentation

## 2014-12-18 DIAGNOSIS — N390 Urinary tract infection, site not specified: Secondary | ICD-10-CM | POA: Diagnosis not present

## 2014-12-18 DIAGNOSIS — Z79899 Other long term (current) drug therapy: Secondary | ICD-10-CM | POA: Diagnosis not present

## 2014-12-18 DIAGNOSIS — K1379 Other lesions of oral mucosa: Secondary | ICD-10-CM | POA: Diagnosis not present

## 2014-12-18 DIAGNOSIS — I10 Essential (primary) hypertension: Secondary | ICD-10-CM | POA: Insufficient documentation

## 2014-12-18 DIAGNOSIS — R319 Hematuria, unspecified: Secondary | ICD-10-CM | POA: Diagnosis not present

## 2014-12-18 DIAGNOSIS — Z8546 Personal history of malignant neoplasm of prostate: Secondary | ICD-10-CM | POA: Insufficient documentation

## 2014-12-18 DIAGNOSIS — Z794 Long term (current) use of insulin: Secondary | ICD-10-CM | POA: Diagnosis not present

## 2014-12-18 DIAGNOSIS — R739 Hyperglycemia, unspecified: Secondary | ICD-10-CM

## 2014-12-18 DIAGNOSIS — E1165 Type 2 diabetes mellitus with hyperglycemia: Secondary | ICD-10-CM | POA: Diagnosis not present

## 2014-12-18 DIAGNOSIS — Z8719 Personal history of other diseases of the digestive system: Secondary | ICD-10-CM | POA: Insufficient documentation

## 2014-12-18 DIAGNOSIS — D649 Anemia, unspecified: Secondary | ICD-10-CM | POA: Diagnosis not present

## 2014-12-18 DIAGNOSIS — Z87438 Personal history of other diseases of male genital organs: Secondary | ICD-10-CM | POA: Insufficient documentation
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
## 2014-12-19 ENCOUNTER — Emergency Department (HOSPITAL_COMMUNITY): Payer: Medicare HMO

## 2014-12-19 LAB — COMPREHENSIVE METABOLIC PANEL
ALBUMIN: 3.9 g/dL (ref 3.5–5.2)
ALT: 26 U/L (ref 0–53)
AST: 26 U/L (ref 0–37)
Alkaline Phosphatase: 77 U/L (ref 39–117)
Anion gap: 10 (ref 5–15)
BUN: 27 mg/dL — ABNORMAL HIGH (ref 6–23)
CHLORIDE: 98 mmol/L (ref 96–112)
CO2: 25 mmol/L (ref 19–32)
CREATININE: 1.52 mg/dL — AB (ref 0.50–1.35)
Calcium: 8.8 mg/dL (ref 8.4–10.5)
GFR calc Af Amer: 55 mL/min — ABNORMAL LOW (ref >90)
GFR calc non Af Amer: 48 mL/min — ABNORMAL LOW (ref >90)
Glucose, Bld: 560 mg/dL (ref 70–99)
Potassium: 3.6 mmol/L (ref 3.5–5.1)
Sodium: 133 mmol/L — ABNORMAL LOW (ref 135–145)
Total Bilirubin: 0.4 mg/dL (ref 0.3–1.2)
Total Protein: 7.7 g/dL (ref 6.0–8.3)

## 2014-12-19 LAB — I-STAT TROPONIN, ED: Troponin i, poc: 0 ng/mL (ref 0.00–0.08)

## 2014-12-19 LAB — URINALYSIS, ROUTINE W REFLEX MICROSCOPIC
Bilirubin Urine: NEGATIVE
Glucose, UA: >1000 mg/dL — AB
Ketones, ur: NEGATIVE mg/dL
Nitrite: NEGATIVE
PROTEIN: 100 mg/dL — AB
SPECIFIC GRAVITY, URINE: 1.027 (ref 1.005–1.030)
UROBILINOGEN UA: 1 mg/dL (ref 0.0–1.0)
pH: 6.5 (ref 5.0–8.0)

## 2014-12-19 LAB — CBC WITH DIFFERENTIAL/PLATELET
Basophils Absolute: 0 10*3/uL (ref 0.0–0.1)
Basophils Relative: 0 % (ref 0–1)
EOS ABS: 0.2 10*3/uL (ref 0.0–0.7)
Eosinophils Relative: 3 % (ref 0–5)
HEMATOCRIT: 35.8 % — AB (ref 39.0–52.0)
HEMOGLOBIN: 11.3 g/dL — AB (ref 13.0–17.0)
LYMPHS PCT: 20 % (ref 12–46)
Lymphs Abs: 1.4 10*3/uL (ref 0.7–4.0)
MCH: 19.1 pg — ABNORMAL LOW (ref 26.0–34.0)
MCHC: 31.6 g/dL (ref 30.0–36.0)
MCV: 60.6 fL — AB (ref 78.0–100.0)
MONO ABS: 0.4 10*3/uL (ref 0.1–1.0)
Monocytes Relative: 6 % (ref 3–12)
NEUTROS ABS: 5 10*3/uL (ref 1.7–7.7)
NEUTROS PCT: 71 % (ref 43–77)
PLATELETS: 148 10*3/uL — AB (ref 150–400)
RBC: 5.91 MIL/uL — ABNORMAL HIGH (ref 4.22–5.81)
RDW: 17.3 % — AB (ref 11.5–15.5)
WBC: 7 10*3/uL (ref 4.0–10.5)

## 2014-12-19 LAB — APTT: aPTT: 28 seconds (ref 24–37)

## 2014-12-19 LAB — URINE MICROSCOPIC-ADD ON

## 2014-12-19 LAB — CBG MONITORING, ED
GLUCOSE-CAPILLARY: 332 mg/dL — AB (ref 70–99)
GLUCOSE-CAPILLARY: 498 mg/dL — AB (ref 70–99)
GLUCOSE-CAPILLARY: 504 mg/dL — AB (ref 70–99)
Glucose-Capillary: 417 mg/dL — ABNORMAL HIGH (ref 70–99)
Glucose-Capillary: 279 mg/dL — ABNORMAL HIGH (ref 70–99)

## 2014-12-19 LAB — PROTIME-INR
INR: 0.96 (ref 0.00–1.49)
PROTHROMBIN TIME: 12.8 s (ref 11.6–15.2)

## 2014-12-19 MED ORDER — SODIUM CHLORIDE 0.9 % IV SOLN
INTRAVENOUS | Status: DC
  Administered 2014-12-19: 4.4 [IU]/h via INTRAVENOUS
  Administered 2014-12-19: 2.2 [IU]/h via INTRAVENOUS
  Administered 2014-12-19: 3.6 [IU]/h via INTRAVENOUS
  Filled 2014-12-19: qty 2.5

## 2014-12-19 MED ORDER — SODIUM CHLORIDE 0.9 % IV BOLUS (SEPSIS)
1000.0000 mL | Freq: Once | INTRAVENOUS | Status: AC
  Administered 2014-12-19: 1000 mL via INTRAVENOUS

## 2014-12-19 MED ORDER — DEXTROSE 5 % IV SOLN
1.0000 g | Freq: Once | INTRAVENOUS | Status: AC
  Administered 2014-12-19: 1 g via INTRAVENOUS
  Filled 2014-12-19: qty 10

## 2014-12-19 MED ORDER — INSULIN ASPART 100 UNIT/ML ~~LOC~~ SOLN
7.0000 [IU] | Freq: Once | SUBCUTANEOUS | Status: AC
  Administered 2014-12-19: 7 [IU] via SUBCUTANEOUS
  Filled 2014-12-19: qty 1

## 2014-12-19 MED ORDER — CEPHALEXIN 500 MG PO CAPS: 500.0000 mg | ORAL_CAPSULE | Freq: Three times a day (TID) | ORAL | Status: DC

## 2014-12-19 MED ORDER — SODIUM CHLORIDE 0.9 % IV SOLN
Freq: Once | INTRAVENOUS | Status: AC
  Administered 2014-12-19: 03:00:00 via INTRAVENOUS

## 2014-12-19 NOTE — ED Notes (Signed)
Patient transported to CT 

## 2014-12-19 NOTE — ED Notes (Signed)
I attempted to get patient blood twice and was unsuccessful.  I made nurse aware as well as MD was in the room.
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
## 2014-12-19 NOTE — Discharge Instructions (Signed)
Start monitoring your blood sugars again and take your insulin as indicated. Follow up with your Primary Care Doctor about your hypertension (high blood pressure). You need to take the antibiotics until gone. Call Dr Ralene Muskrat office to get an appointment to evaluate you further for the blood in your urine and coming out of your penis.

## 2014-12-21 LAB — URINE CULTURE: Colony Count: >=100000

## 2014-12-22 ENCOUNTER — Telehealth (HOSPITAL_BASED_OUTPATIENT_CLINIC_OR_DEPARTMENT_OTHER): Payer: Self-pay | Admitting: Emergency Medicine

## 2014-12-22 NOTE — Progress Notes (Signed)
ED Antimicrobial Stewardship Positive Culture Follow Up   Gordon Young is an 64 y.o. male who presented to [facility] on 12/18/2014 with a chief complaint of  Chief Complaint  Patient presents with  . Hyperglycemia  . Hypertension  . Hematuria  . Dysuria    Recent Results (from the past 720 hour(s))  Urine culture     Status: None   Collection Time: 12/19/14 12:11 AM  Result Value Ref Range Status   Specimen Description URINE, CLEAN CATCH  Final   Special Requests NONE  Final   Colony Count   Final    >=100,000 COLONIES/ML Performed at [facility]    Culture   Final    ENTEROBACTER CLOACAE Performed at [facility]    Report Status 12/21/2014 FINAL  Final   Organism ID, Bacteria ENTEROBACTER CLOACAE  Final      Susceptibility   Enterobacter cloacae - MIC*    CEFAZOLIN >=64 RESISTANT Resistant     CEFTRIAXONE <=1 SENSITIVE Sensitive     CIPROFLOXACIN <=0.25 SENSITIVE Sensitive     GENTAMICIN <=1 SENSITIVE Sensitive     LEVOFLOXACIN <=0.12 SENSITIVE Sensitive     NITROFURANTOIN 64 INTERMEDIATE Intermediate     TOBRAMYCIN <=1 SENSITIVE Sensitive     TRIMETH/SULFA <=20 SENSITIVE Sensitive     PIP/TAZO 8 SENSITIVE Sensitive     * ENTEROBACTER CLOACAE    [x]  Treated with Cephalexin, organism resistant to prescribed antimicrobial  34 yoM presents with urinary symptoms possibly related to uncontrolled diabetes. CT of pelvis showed bilateral hydronephrosis with hydroureter, no stones, but hypertrophy possibly related to infection.  Patient discharged on Cephalexin.  Urine culture grew Enterobacter resistant to Cefazolin, sensitive to Ciprofloxacin and bactrim.  Will instruct patient to stop Cephalexin and start Ciprofloxacin as below.  Patient instructed to follow-up with PCP or Urology.  New antibiotic prescription: Ciprofloxacin 500 mg twice daily for 7 days.  ED Provider: Lorre Munroe, PA-C   Meliah Appleman, Maud Deed 12/22/2014, 12:02 PM Infectious Diseases  Pharmacist Phone# [phone]

## 2014-12-27 ENCOUNTER — Emergency Department (HOSPITAL_COMMUNITY)
Admission: EM | Admit: 2014-12-27 | Discharge: 2014-12-28 | Disposition: A | Discharge status: Home / Self Care | Payer: Medicare HMO | Attending: Emergency Medicine | Admitting: Emergency Medicine

## 2014-12-27 ENCOUNTER — Encounter (HOSPITAL_COMMUNITY): Payer: Self-pay | Admitting: Emergency Medicine

## 2014-12-27 DIAGNOSIS — I10 Essential (primary) hypertension: Secondary | ICD-10-CM | POA: Insufficient documentation

## 2014-12-27 DIAGNOSIS — H9190 Unspecified hearing loss, unspecified ear: Secondary | ICD-10-CM | POA: Insufficient documentation

## 2014-12-27 DIAGNOSIS — E119 Type 2 diabetes mellitus without complications: Secondary | ICD-10-CM | POA: Diagnosis not present

## 2014-12-27 DIAGNOSIS — Z8719 Personal history of other diseases of the digestive system: Secondary | ICD-10-CM | POA: Insufficient documentation

## 2014-12-27 DIAGNOSIS — Z87828 Personal history of other (healed) physical injury and trauma: Secondary | ICD-10-CM | POA: Insufficient documentation

## 2014-12-27 DIAGNOSIS — Z8781 Personal history of (healed) traumatic fracture: Secondary | ICD-10-CM | POA: Diagnosis not present

## 2014-12-27 DIAGNOSIS — Z79899 Other long term (current) drug therapy: Secondary | ICD-10-CM | POA: Insufficient documentation

## 2014-12-27 DIAGNOSIS — Z794 Long term (current) use of insulin: Secondary | ICD-10-CM | POA: Insufficient documentation

## 2014-12-27 DIAGNOSIS — M1990 Unspecified osteoarthritis, unspecified site: Secondary | ICD-10-CM | POA: Diagnosis not present

## 2014-12-27 DIAGNOSIS — Z792 Long term (current) use of antibiotics: Secondary | ICD-10-CM | POA: Insufficient documentation

## 2014-12-27 DIAGNOSIS — N390 Urinary tract infection, site not specified: Secondary | ICD-10-CM | POA: Insufficient documentation

## 2014-12-27 DIAGNOSIS — Z8546 Personal history of malignant neoplasm of prostate: Secondary | ICD-10-CM | POA: Diagnosis not present

## 2014-12-27 NOTE — ED Notes (Signed)
Pt seen in ED a week ago for bladder infection. Pt given antibiotics and reports he still has the bladder infection.

## 2014-12-28 LAB — CBG MONITORING, ED: Glucose-Capillary: 51 mg/dL — ABNORMAL LOW (ref 70–99)

## 2014-12-28 MED ORDER — CIPROFLOXACIN HCL 500 MG PO TABS
500.0000 mg | ORAL_TABLET | Freq: Two times a day (BID) | ORAL | Status: DC
Start: 2014-12-28 — End: 2015-03-06

## 2014-12-28 MED ORDER — CIPROFLOXACIN HCL 500 MG PO TABS
500.0000 mg | ORAL_TABLET | Freq: Once | ORAL | Status: AC
  Administered 2014-12-28: 500 mg via ORAL
  Filled 2014-12-28: qty 1
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
## 2014-12-28 NOTE — ED Notes (Signed)
Patient can be reached via son's number: [phone] (okay to leave a message)

## 2014-12-28 NOTE — Discharge Instructions (Signed)

## 2014-12-28 NOTE — ED Notes (Signed)
Patient given orange juice to drink.

## 2014-12-28 NOTE — ED Notes (Signed)
Patient expressed to this nurse that he does not have money to fill his prescription and will not have money until the 3rd of next month.  He states that he has family, but they are unreliable.  Social work contacted, Patent attorney with patient's information .

## 2015-01-19 ENCOUNTER — Telehealth (HOSPITAL_BASED_OUTPATIENT_CLINIC_OR_DEPARTMENT_OTHER): Payer: Self-pay | Admitting: Emergency Medicine

## 2015-01-19 NOTE — Telephone Encounter (Signed)
Lost to followup 

## 2015-03-05 ENCOUNTER — Emergency Department (HOSPITAL_COMMUNITY): Payer: Medicare HMO

## 2015-03-05 ENCOUNTER — Encounter (HOSPITAL_COMMUNITY): Payer: Self-pay | Admitting: *Deleted

## 2015-03-05 ENCOUNTER — Inpatient Hospital Stay (HOSPITAL_COMMUNITY)
Admission: EM | Admit: 2015-03-05 | Discharge: 2015-03-08 | DRG: 605 | Disposition: A | Discharge status: Home / Self Care | Payer: Medicare HMO | Attending: Internal Medicine | Admitting: Internal Medicine

## 2015-03-05 DIAGNOSIS — Z79899 Other long term (current) drug therapy: Secondary | ICD-10-CM

## 2015-03-05 DIAGNOSIS — M7989 Other specified soft tissue disorders: Secondary | ICD-10-CM

## 2015-03-05 DIAGNOSIS — D509 Iron deficiency anemia, unspecified: Secondary | ICD-10-CM | POA: Diagnosis present

## 2015-03-05 DIAGNOSIS — E785 Hyperlipidemia, unspecified: Secondary | ICD-10-CM | POA: Diagnosis present

## 2015-03-05 DIAGNOSIS — E119 Type 2 diabetes mellitus without complications: Secondary | ICD-10-CM | POA: Diagnosis present

## 2015-03-05 DIAGNOSIS — Z794 Long term (current) use of insulin: Secondary | ICD-10-CM

## 2015-03-05 DIAGNOSIS — E291 Testicular hypofunction: Secondary | ICD-10-CM | POA: Diagnosis present

## 2015-03-05 DIAGNOSIS — Z87820 Personal history of traumatic brain injury: Secondary | ICD-10-CM

## 2015-03-05 DIAGNOSIS — Z23 Encounter for immunization: Secondary | ICD-10-CM

## 2015-03-05 DIAGNOSIS — Z8546 Personal history of malignant neoplasm of prostate: Secondary | ICD-10-CM

## 2015-03-05 DIAGNOSIS — E1165 Type 2 diabetes mellitus with hyperglycemia: Secondary | ICD-10-CM | POA: Diagnosis present

## 2015-03-05 DIAGNOSIS — S91331A Puncture wound without foreign body, right foot, initial encounter: Secondary | ICD-10-CM | POA: Diagnosis not present

## 2015-03-05 DIAGNOSIS — L97519 Non-pressure chronic ulcer of other part of right foot with unspecified severity: Secondary | ICD-10-CM | POA: Diagnosis present

## 2015-03-05 DIAGNOSIS — N529 Male erectile dysfunction, unspecified: Secondary | ICD-10-CM | POA: Diagnosis present

## 2015-03-05 DIAGNOSIS — H9193 Unspecified hearing loss, bilateral: Secondary | ICD-10-CM | POA: Diagnosis present

## 2015-03-05 DIAGNOSIS — I10 Essential (primary) hypertension: Secondary | ICD-10-CM | POA: Diagnosis present

## 2015-03-05 DIAGNOSIS — M79661 Pain in right lower leg: Secondary | ICD-10-CM

## 2015-03-05 DIAGNOSIS — K219 Gastro-esophageal reflux disease without esophagitis: Secondary | ICD-10-CM | POA: Diagnosis present

## 2015-03-05 DIAGNOSIS — L03115 Cellulitis of right lower limb: Secondary | ICD-10-CM

## 2015-03-05 DIAGNOSIS — Y92002 Bathroom of unspecified non-institutional (private) residence single-family (private) house as the place of occurrence of the external cause: Secondary | ICD-10-CM

## 2015-03-05 DIAGNOSIS — W228XXA Striking against or struck by other objects, initial encounter: Secondary | ICD-10-CM | POA: Diagnosis present

## 2015-03-05 DIAGNOSIS — M1990 Unspecified osteoarthritis, unspecified site: Secondary | ICD-10-CM | POA: Diagnosis present

## 2015-03-05 DIAGNOSIS — E1140 Type 2 diabetes mellitus with diabetic neuropathy, unspecified: Secondary | ICD-10-CM | POA: Diagnosis present

## 2015-03-05 DIAGNOSIS — E11621 Type 2 diabetes mellitus with foot ulcer: Secondary | ICD-10-CM | POA: Diagnosis present

## 2015-03-05 DIAGNOSIS — S91339A Puncture wound without foreign body, unspecified foot, initial encounter: Secondary | ICD-10-CM | POA: Diagnosis present

## 2015-03-05 DIAGNOSIS — N400 Enlarged prostate without lower urinary tract symptoms: Secondary | ICD-10-CM | POA: Diagnosis present

## 2015-03-05 LAB — CBC WITH DIFFERENTIAL/PLATELET
BASOS PCT: 0 % (ref 0–1)
Basophils Absolute: 0 10*3/uL (ref 0.0–0.1)
EOS PCT: 4 % (ref 0–5)
Eosinophils Absolute: 0.2 10*3/uL (ref 0.0–0.7)
HCT: 28.7 % — ABNORMAL LOW (ref 39.0–52.0)
HEMOGLOBIN: 9.2 g/dL — AB (ref 13.0–17.0)
Lymphocytes Relative: 31 % (ref 12–46)
Lymphs Abs: 1.8 10*3/uL (ref 0.7–4.0)
MCH: 18.9 pg — ABNORMAL LOW (ref 26.0–34.0)
MCHC: 32.1 g/dL (ref 30.0–36.0)
MCV: 58.9 fL — AB (ref 78.0–100.0)
MONO ABS: 0.5 10*3/uL (ref 0.1–1.0)
MONOS PCT: 8 % (ref 3–12)
Neutro Abs: 3.2 10*3/uL (ref 1.7–7.7)
Neutrophils Relative %: 57 % (ref 43–77)
PLATELETS: 165 10*3/uL (ref 150–400)
RBC: 4.87 MIL/uL (ref 4.22–5.81)
RDW: 17.3 % — ABNORMAL HIGH (ref 11.5–15.5)
WBC: 5.7 10*3/uL (ref 4.0–10.5)

## 2015-03-05 LAB — COMPREHENSIVE METABOLIC PANEL
ALBUMIN: 2.7 g/dL — AB (ref 3.5–5.0)
ALK PHOS: 49 U/L (ref 38–126)
ALT: 13 U/L — AB (ref 17–63)
AST: 20 U/L (ref 15–41)
Anion gap: 9 (ref 5–15)
BILIRUBIN TOTAL: 0.4 mg/dL (ref 0.3–1.2)
BUN: 30 mg/dL — ABNORMAL HIGH (ref 6–20)
CHLORIDE: 103 mmol/L (ref 101–111)
CO2: 23 mmol/L (ref 22–32)
CREATININE: 1.48 mg/dL — AB (ref 0.61–1.24)
Calcium: 8.3 mg/dL — ABNORMAL LOW (ref 8.9–10.3)
GFR calc Af Amer: 57 mL/min — ABNORMAL LOW (ref >60)
GFR, EST NON AFRICAN AMERICAN: 49 mL/min — AB (ref >60)
Glucose, Bld: 227 mg/dL — ABNORMAL HIGH (ref 65–99)
POTASSIUM: 4.3 mmol/L (ref 3.5–5.1)
Sodium: 135 mmol/L (ref 135–145)
Total Protein: 6.1 g/dL — ABNORMAL LOW (ref 6.5–8.1)

## 2015-03-05 MED ORDER — SODIUM CHLORIDE 0.9 % IV BOLUS (SEPSIS)
1000.0000 mL | Freq: Once | INTRAVENOUS | Status: AC
  Administered 2015-03-06: 1000 mL via INTRAVENOUS
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
## 2015-03-05 NOTE — ED Notes (Signed)
The pt is  C/o rt lower leg pain and swelling for 2-3 days.  No known injury

## 2015-03-05 NOTE — ED Notes (Signed)
Placed patient in a gown.

## 2015-03-05 NOTE — ED Notes (Addendum)
Pt states that he did not know that he had a wound on his foot. Pt states that he is diabetic.

## 2015-03-06 ENCOUNTER — Observation Stay (HOSPITAL_COMMUNITY): Payer: Medicare HMO

## 2015-03-06 DIAGNOSIS — Z8546 Personal history of malignant neoplasm of prostate: Secondary | ICD-10-CM | POA: Diagnosis not present

## 2015-03-06 DIAGNOSIS — E1142 Type 2 diabetes mellitus with diabetic polyneuropathy: Secondary | ICD-10-CM

## 2015-03-06 DIAGNOSIS — E785 Hyperlipidemia, unspecified: Secondary | ICD-10-CM | POA: Diagnosis not present

## 2015-03-06 DIAGNOSIS — L97519 Non-pressure chronic ulcer of other part of right foot with unspecified severity: Secondary | ICD-10-CM | POA: Diagnosis not present

## 2015-03-06 DIAGNOSIS — N529 Male erectile dysfunction, unspecified: Secondary | ICD-10-CM | POA: Diagnosis not present

## 2015-03-06 DIAGNOSIS — E1165 Type 2 diabetes mellitus with hyperglycemia: Secondary | ICD-10-CM | POA: Diagnosis not present

## 2015-03-06 DIAGNOSIS — E291 Testicular hypofunction: Secondary | ICD-10-CM | POA: Diagnosis not present

## 2015-03-06 DIAGNOSIS — Z794 Long term (current) use of insulin: Secondary | ICD-10-CM | POA: Diagnosis not present

## 2015-03-06 DIAGNOSIS — S91339A Puncture wound without foreign body, unspecified foot, initial encounter: Secondary | ICD-10-CM | POA: Diagnosis present

## 2015-03-06 DIAGNOSIS — L03115 Cellulitis of right lower limb: Secondary | ICD-10-CM | POA: Diagnosis not present

## 2015-03-06 DIAGNOSIS — Y92002 Bathroom of unspecified non-institutional (private) residence single-family (private) house as the place of occurrence of the external cause: Secondary | ICD-10-CM | POA: Diagnosis not present

## 2015-03-06 DIAGNOSIS — K219 Gastro-esophageal reflux disease without esophagitis: Secondary | ICD-10-CM | POA: Diagnosis not present

## 2015-03-06 DIAGNOSIS — D509 Iron deficiency anemia, unspecified: Secondary | ICD-10-CM

## 2015-03-06 DIAGNOSIS — W228XXA Striking against or struck by other objects, initial encounter: Secondary | ICD-10-CM | POA: Diagnosis not present

## 2015-03-06 DIAGNOSIS — E11621 Type 2 diabetes mellitus with foot ulcer: Secondary | ICD-10-CM | POA: Diagnosis not present

## 2015-03-06 DIAGNOSIS — M1990 Unspecified osteoarthritis, unspecified site: Secondary | ICD-10-CM | POA: Diagnosis not present

## 2015-03-06 DIAGNOSIS — S91331A Puncture wound without foreign body, right foot, initial encounter: Principal | ICD-10-CM

## 2015-03-06 DIAGNOSIS — Z87820 Personal history of traumatic brain injury: Secondary | ICD-10-CM | POA: Diagnosis not present

## 2015-03-06 DIAGNOSIS — Z79899 Other long term (current) drug therapy: Secondary | ICD-10-CM | POA: Diagnosis not present

## 2015-03-06 DIAGNOSIS — M79661 Pain in right lower leg: Secondary | ICD-10-CM | POA: Diagnosis not present

## 2015-03-06 DIAGNOSIS — M7989 Other specified soft tissue disorders: Secondary | ICD-10-CM | POA: Diagnosis not present

## 2015-03-06 DIAGNOSIS — R609 Edema, unspecified: Secondary | ICD-10-CM | POA: Diagnosis not present

## 2015-03-06 DIAGNOSIS — E1140 Type 2 diabetes mellitus with diabetic neuropathy, unspecified: Secondary | ICD-10-CM | POA: Diagnosis not present

## 2015-03-06 DIAGNOSIS — N400 Enlarged prostate without lower urinary tract symptoms: Secondary | ICD-10-CM | POA: Diagnosis not present

## 2015-03-06 DIAGNOSIS — H9193 Unspecified hearing loss, bilateral: Secondary | ICD-10-CM | POA: Diagnosis not present

## 2015-03-06 DIAGNOSIS — Z23 Encounter for immunization: Secondary | ICD-10-CM | POA: Diagnosis not present

## 2015-03-06 DIAGNOSIS — I10 Essential (primary) hypertension: Secondary | ICD-10-CM | POA: Diagnosis not present

## 2015-03-06 LAB — CBC
HEMATOCRIT: 30 % — AB (ref 39.0–52.0)
HEMOGLOBIN: 9.6 g/dL — AB (ref 13.0–17.0)
MCH: 19.2 pg — ABNORMAL LOW (ref 26.0–34.0)
MCHC: 32 g/dL (ref 30.0–36.0)
MCV: 60 fL — ABNORMAL LOW (ref 78.0–100.0)
Platelets: 158 10*3/uL (ref 150–400)
RBC: 5 MIL/uL (ref 4.22–5.81)
RDW: 17.6 % — ABNORMAL HIGH (ref 11.5–15.5)
WBC: 5.3 10*3/uL (ref 4.0–10.5)

## 2015-03-06 LAB — PROTIME-INR
INR: 1.03 (ref 0.00–1.49)
Prothrombin Time: 13.7 seconds (ref 11.6–15.2)

## 2015-03-06 LAB — COMPREHENSIVE METABOLIC PANEL
ALT: 15 U/L — ABNORMAL LOW (ref 17–63)
AST: 21 U/L (ref 15–41)
Albumin: 2.7 g/dL — ABNORMAL LOW (ref 3.5–5.0)
Alkaline Phosphatase: 59 U/L (ref 38–126)
Anion gap: 9 (ref 5–15)
BUN: 27 mg/dL — ABNORMAL HIGH (ref 6–20)
CO2: 24 mmol/L (ref 22–32)
Calcium: 8.3 mg/dL — ABNORMAL LOW (ref 8.9–10.3)
Chloride: 103 mmol/L (ref 101–111)
Creatinine, Ser: 1.37 mg/dL — ABNORMAL HIGH (ref 0.61–1.24)
GFR calc Af Amer: >60 mL/min (ref >60)
GFR, EST NON AFRICAN AMERICAN: 54 mL/min — AB (ref >60)
Glucose, Bld: 203 mg/dL — ABNORMAL HIGH (ref 65–99)
Potassium: 4 mmol/L (ref 3.5–5.1)
Sodium: 136 mmol/L (ref 135–145)
Total Bilirubin: 0.2 mg/dL — ABNORMAL LOW (ref 0.3–1.2)
Total Protein: 6.7 g/dL (ref 6.5–8.1)

## 2015-03-06 LAB — HIV ANTIBODY (ROUTINE TESTING W REFLEX): HIV SCREEN 4TH GENERATION: NONREACTIVE

## 2015-03-06 LAB — GLUCOSE, CAPILLARY
GLUCOSE-CAPILLARY: 172 mg/dL — AB (ref 65–99)
Glucose-Capillary: 157 mg/dL — ABNORMAL HIGH (ref 65–99)
Glucose-Capillary: 164 mg/dL — ABNORMAL HIGH (ref 65–99)
Glucose-Capillary: 216 mg/dL — ABNORMAL HIGH (ref 65–99)
Glucose-Capillary: 146 mg/dL — ABNORMAL HIGH (ref 65–99)

## 2015-03-06 LAB — C-REACTIVE PROTEIN: CRP: 6.1 mg/dL — ABNORMAL HIGH (ref <1.0)

## 2015-03-06 LAB — PREALBUMIN: Prealbumin: 15.1 mg/dL — ABNORMAL LOW (ref 18–38)

## 2015-03-06 LAB — SEDIMENTATION RATE: Sed Rate: 71 mm/hr — ABNORMAL HIGH (ref 0–16)

## 2015-03-06 MED ORDER — INSULIN GLARGINE 100 UNIT/ML ~~LOC~~ SOLN
24.0000 [IU] | Freq: Every day | SUBCUTANEOUS | Status: DC
  Administered 2015-03-06 – 2015-03-07 (×3): 24 [IU] via SUBCUTANEOUS
  Filled 2015-03-06 (×4): qty 0.24

## 2015-03-06 MED ORDER — DOXYCYCLINE HYCLATE 100 MG PO TABS
100.0000 mg | ORAL_TABLET | Freq: Two times a day (BID) | ORAL | Status: DC
  Administered 2015-03-06 – 2015-03-08 (×5): 100 mg via ORAL
  Filled 2015-03-06 (×5): qty 1

## 2015-03-06 MED ORDER — INSULIN ASPART 100 UNIT/ML ~~LOC~~ SOLN: 0.0000 [IU] | Freq: Every day | SUBCUTANEOUS | Status: DC

## 2015-03-06 MED ORDER — ONDANSETRON HCL 4 MG/2ML IJ SOLN: 4.0000 mg | Freq: Four times a day (QID) | INTRAMUSCULAR | Status: DC | PRN

## 2015-03-06 MED ORDER — ACETAMINOPHEN 325 MG PO TABS: 650.0000 mg | ORAL_TABLET | Freq: Four times a day (QID) | ORAL | Status: DC | PRN

## 2015-03-06 MED ORDER — PNEUMOCOCCAL VAC POLYVALENT 25 MCG/0.5ML IJ INJ
0.5000 mL | INJECTION | INTRAMUSCULAR | Status: AC
  Administered 2015-03-07: 0.5 mL via INTRAMUSCULAR
  Filled 2015-03-06: qty 0.5

## 2015-03-06 MED ORDER — CIPROFLOXACIN IN D5W 400 MG/200ML IV SOLN
400.0000 mg | Freq: Two times a day (BID) | INTRAVENOUS | Status: DC
  Administered 2015-03-06 – 2015-03-07 (×5): 400 mg via INTRAVENOUS
  Filled 2015-03-06 (×6): qty 200

## 2015-03-06 MED ORDER — HEPARIN SODIUM (PORCINE) 5000 UNIT/ML IJ SOLN
5000.0000 [IU] | Freq: Three times a day (TID) | INTRAMUSCULAR | Status: DC
  Administered 2015-03-06 – 2015-03-08 (×7): 5000 [IU] via SUBCUTANEOUS
  Filled 2015-03-06 (×7): qty 1

## 2015-03-06 MED ORDER — ONDANSETRON HCL 4 MG PO TABS: 4.0000 mg | ORAL_TABLET | Freq: Four times a day (QID) | ORAL | Status: DC | PRN

## 2015-03-06 MED ORDER — TETANUS-DIPHTH-ACELL PERTUSSIS 5-2.5-18.5 LF-MCG/0.5 IM SUSP
0.5000 mL | Freq: Once | INTRAMUSCULAR | Status: AC
  Administered 2015-03-06: 0.5 mL via INTRAMUSCULAR
  Filled 2015-03-06: qty 0.5

## 2015-03-06 MED ORDER — SODIUM CHLORIDE 0.9 % IV SOLN
INTRAVENOUS | Status: DC
  Administered 2015-03-06: 03:00:00 via INTRAVENOUS

## 2015-03-06 MED ORDER — INSULIN ASPART 100 UNIT/ML ~~LOC~~ SOLN
0.0000 [IU] | Freq: Three times a day (TID) | SUBCUTANEOUS | Status: DC
  Administered 2015-03-06: 5 [IU] via SUBCUTANEOUS
  Administered 2015-03-06 (×2): 3 [IU] via SUBCUTANEOUS
  Administered 2015-03-07: 2 [IU] via SUBCUTANEOUS
  Administered 2015-03-07: 5 [IU] via SUBCUTANEOUS
  Administered 2015-03-07: 3 [IU] via SUBCUTANEOUS
  Administered 2015-03-08: 2 [IU] via SUBCUTANEOUS
  Administered 2015-03-08: 3 [IU] via SUBCUTANEOUS

## 2015-03-06 MED ORDER — ACETAMINOPHEN 650 MG RE SUPP: 650.0000 mg | Freq: Four times a day (QID) | RECTAL | Status: DC | PRN

## 2015-03-06 MED ORDER — DOXYCYCLINE HYCLATE 100 MG PO TABS
100.0000 mg | ORAL_TABLET | Freq: Once | ORAL | Status: AC
  Administered 2015-03-06: 100 mg via ORAL
  Filled 2015-03-06: qty 1

## 2015-03-06 MED ORDER — LISINOPRIL 40 MG PO TABS
40.0000 mg | ORAL_TABLET | Freq: Every day | ORAL | Status: DC
  Administered 2015-03-06 – 2015-03-08 (×3): 40 mg via ORAL
  Filled 2015-03-06 (×3): qty 1

## 2015-03-06 NOTE — ED Notes (Signed)
This RN unable to gain IV access.

## 2015-03-06 NOTE — ED Notes (Signed)
Patel, MD at bedside.  

## 2015-03-06 NOTE — Progress Notes (Signed)
   CSW received referral for assistance with medications.   CM notified.  No CSW needs noted.    [facility]  2S, 45M,3S, 5N, 6N [phone]
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
## 2015-03-06 NOTE — Progress Notes (Signed)
Inpatient Diabetes Program Recommendations  AACE/ADA: New Consensus Statement on Inpatient Glycemic Control (2013)  Target Ranges:  Prepandial:   less than 140 mg/dL      Peak postprandial:   less than 180 mg/dL (1-2 hours)      Critically ill patients:  140 - 180 mg/dL   Agree with current orders for glycemic management. Will follow. Thank you  Raoul Pitch BSN, RN,CDE Inpatient Diabetes Coordinator [phone] (team pager)

## 2015-03-06 NOTE — ED Notes (Signed)
2 nurses unable to gain IV access.

## 2015-03-06 NOTE — Progress Notes (Signed)
Patient admitted after midnight- please see H&P. Ulcer in a diabetic -abx -wound care -tetanus shot (nurse to verify last time he had)  DM -SSI  Eulogio Bear DO

## 2015-03-06 NOTE — ED Notes (Signed)
IV team at bedside 

## 2015-03-06 NOTE — ED Notes (Signed)
Transporting patient to new room assignment. 

## 2015-03-06 NOTE — Progress Notes (Signed)
VASCULAR LAB PRELIMINARY  ARTERIAL  ABI completed:ABIs indicate adequate arterial flow to the lower extremities.  Cannot rule out vessel calcification secondary to increased pedal pressures.    RIGHT    LEFT    PRESSURE WAVEFORM  PRESSURE WAVEFORM  BRACHIAL 174 T BRACHIAL 163 T  DP   DP    AT 243 B AT 238 B  PT 253 B PT 235 B  PER   PER    GREAT TOE  NA GREAT TOE  NA    RIGHT LEFT  ABI >1 >1     Kameron Blethen, RVT 03/06/2015, 11:38 AM

## 2015-03-06 NOTE — Progress Notes (Signed)
VASCULAR LAB PRELIMINARY  PRELIMINARY  PRELIMINARY  PRELIMINARY  Right lower extremity venous Doppler completed.    Preliminary report:  There is no DVT or SVT noted in the right lower extremity.    Arless Vineyard, RVT 03/06/2015, 11:34 AM

## 2015-03-07 DIAGNOSIS — S91331A Puncture wound without foreign body, right foot, initial encounter: Secondary | ICD-10-CM | POA: Diagnosis not present

## 2015-03-07 LAB — HEMOGLOBIN A1C
Hgb A1c MFr Bld: 13.8 % — ABNORMAL HIGH (ref 4.8–5.6)
Mean Plasma Glucose: 349 mg/dL

## 2015-03-07 LAB — GLUCOSE, CAPILLARY
GLUCOSE-CAPILLARY: 122 mg/dL — AB (ref 65–99)
GLUCOSE-CAPILLARY: 174 mg/dL — AB (ref 65–99)
Glucose-Capillary: 201 mg/dL — ABNORMAL HIGH (ref 65–99)
Glucose-Capillary: 190 mg/dL — ABNORMAL HIGH (ref 65–99)

## 2015-03-07 MED ORDER — HYDRALAZINE HCL 20 MG/ML IJ SOLN
10.0000 mg | Freq: Once | INTRAMUSCULAR | Status: AC
  Administered 2015-03-07: 10 mg via INTRAVENOUS
  Filled 2015-03-07: qty 1

## 2015-03-07 MED ORDER — GLUCERNA SHAKE PO LIQD
237.0000 mL | Freq: Three times a day (TID) | ORAL | Status: DC
  Administered 2015-03-07 – 2015-03-08 (×3): 237 mL via ORAL

## 2015-03-07 NOTE — Progress Notes (Signed)
CM spoke with patient in his room.  Patient states he is two months behind in his apartment rent.  Cost is $425.00 and therefore has court order due to inability to pay.  Says he will also have to pay the court fee which he does not know the amount at this time.  Patient is need of food and assistance to pay for his rent.  SW consulted for patients social needs: rent and food.  Patient says he goes to a church to seek for assistance with food and if they can borrow him money to pay for his rent.  Patient says he receives Social Security benefit.  CM will follow up on DME: [facility], this will be verbally communicated to [facility] tomorrow morning if they are able to assist patient.  Also, patient needs assistance with his medication, CM has explained to patient about Match Program and can be used only when discharge medications are known.  Patient says he is able to dress his wound if he has supplies, Nurse plans to get supply for patient prior to discharge.  Patient says he goes to a clinic on [address] called [facility] but office has moved and he has to find their new address and money to be able to follow-up with his care.  Patient does not have a PCP and his Diabetes is poorly managed as a result.  CM is available if any need arise.

## 2015-03-07 NOTE — Care Management Note (Signed)
Case Management Note  Patient Details  Name: [name] MRN: [id number] Date of Birth: [birth year]  Subjective/Objective:          Admitted with Puncture wound of foot excluding toes with infection (right)        Action/Plan: S/W to arrange for food, rent and transportation.  CM will f/u on DME, Medications, PCP6  Expected Discharge Date:       03/08/15           Expected Discharge Plan:  Home/Self Care  In-House Referral:  Clinical Social Work  Discharge planning Services  CM Consult  Post Acute Care Choice:    Choice offered to:  Patient  DME Arranged:    DME Agency:     HH Arranged:    [place] Agency:     Status of Service:  In process, will continue to follow  Medicare Important Message Given:    Date Medicare IM Given:    Medicare IM give by:    Date Additional Medicare IM Given:    Additional Medicare Important Message give by:     If discussed at [place] of Stay Meetings, dates discussed:    Additional Comments:  Dimas Aguas, RN 03/07/2015, 9:20 PM

## 2015-03-07 NOTE — Progress Notes (Addendum)
Patient has been worried about how to obtain his medications, losing his apartment and to get to his follow up appointments.  I told him I would pass this information to the day shift and to the Case Manager.

## 2015-03-07 NOTE — Progress Notes (Signed)
Initial Nutrition Assessment  DOCUMENTATION CODES:  Not applicable  INTERVENTION:  Glucerna shake (TID), each supplement provides 220 kcal and 10 grams of protein.  Encourage adequate PO intake.   NUTRITION DIAGNOSIS:  Increased nutrient needs related to wound healing as evidenced by estimated needs.  GOAL:  Patient will meet greater than or equal to 90% of their needs  MONITOR:  PO intake, Supplement acceptance, Weight trends, Labs, I & O's  REASON FOR ASSESSMENT:  Consult Wound healing  ASSESSMENT: Pt with Past medical history of diabetes mellitus type 2, uncontrolled, hypertension, hypogonadism, GERD, peripheral neuropathy.The patient is presenting with complaints of swelling of his legs ongoing since last 3 days. Pt with an open ulcer, right foot.  Pt reports having a good appetite currently and PTA at home with 2-3 meals a day and a protein shake on occasion. Current meal completion is 100%. Usual body weight reported to be ~175 lbs. Pt is agreeable to Glucerna shake to aid in wound healing. RD to order.   NFPE completed. Pt with mild to moderate muscle mass loss in the temple region. No other areas observed to have significant fat or muscle mass loss.   Height:  Ht Readings from Last 1 Encounters:  03/06/15 5\' 7"  (1.702 m)    Weight:  Wt Readings from Last 1 Encounters:  03/06/15 187 lb 6.3 oz (85 kg)    Ideal Body Weight:  67.27 kg  Wt Readings from Last 10 Encounters:  03/06/15 187 lb 6.3 oz (85 kg)  02/23/14 198 lb (89.812 kg)  11/13/13 212 lb (96.163 kg)  07/08/13 206 lb 3.2 oz (93.532 kg)  02/15/09 235 lb (106.595 kg)  12/14/08 232 lb (105.235 kg)  11/16/08 218 lb 8 oz (99.111 kg)  08/17/08 219 lb (99.338 kg)  06/03/08 219 lb (99.338 kg)  04/30/08 214 lb (97.07 kg)    BMI:  Body mass index is 29.34 kg/(m^2).  Estimated Nutritional Needs:  Kcal:  2050-2300  Protein:  100-115 grams  Fluid:  2 - 2.3 L/day  Skin:  Wound (see comment)  (Diabetic ulcer R foot, non-pitting LE edema)  Diet Order:  Diet heart healthy/carb modified Room service appropriate?: Yes; Fluid consistency:: Thin  EDUCATION NEEDS:  No education needs identified at this time   Intake/Output Summary (Last 24 hours) at 03/07/15 1247 Last data filed at 03/07/15 0900  Gross per 24 hour  Intake    480 ml  Output      0 ml  Net    480 ml    Last BM:  6/26  Corrin Parker, MS, RD, LDN Pager # [phone] After hours/ weekend pager # [phone]

## 2015-03-07 NOTE — Progress Notes (Signed)
[facility] PROGRESS NOTE  Gordon Young TDV:[id number] DOB: [birth year] DOA: 03/31/2015 PCP: Mack Hook, MD  Assessment/Plan: 1. Puncture wound of foot excluding toes with infection The patient is presenting with complaints of an open ulcer on the right foot associated with leg pain as well as leg swelling. With his history of diabetes the patient is at high risk for developing nonhealing ulcer. Continue with doxycycline and cipro.  ESR 71 and CRP 6.1. Wound care consulted. Appreciated recommendation,.  Venous doppler negative for DVT, ABI good blood flow.  X  ray no evidence of fracture.   2. Diabetes mellitus. hemoglobin A1c at 13.  Continuing insulin 20 units daily at bedtime. Diabetic educator. CM consulted assistance for medications.   3. hypertension. Continuing lisinopril 40. Renal function stable.   4. microcytic anemia. Appears chronic. No active bleeding.   5.Neuropathy.    Code Status: Full code.  Family Communication: care discussed with patient.  Disposition Plan: home likely 6-28   Consultants:  none  Procedures:  Doppler negative for DVT  Antibiotics:  Doxy, cipro  HPI/Subjective: Swelling has decreased. No Right LE pain.  No abdominal pain. Has not been using insulin regulasrly  Objective: Filed Vitals:   03/07/15 0554  BP: 129/83  Pulse: 91  Temp: 98.5 F (36.9 C)  Resp:     Intake/Output Summary (Last 24 hours) at 03/07/15 0935 Last data filed at 03/07/15 0900  Gross per 24 hour  Intake    480 ml  Output      0 ml  Net    480 ml   Filed Weights   31-Mar-2015 2106 03/06/15 0315  Weight: 81.647 kg (180 lb) 85 kg (187 lb 6.3 oz)    Exam:   General:  Alert in no distress.   Cardiovascular: S 1, S 2 RRR  Respiratory: CTA  Abdomen: BS present, soft, nt  Musculoskeletal: right LE with edema. Open wound plantar aspect, no drainage.   Data Reviewed: Basic Metabolic Panel:  Recent Labs Lab 2015/03/31 2236  03/06/15 0232  NA 135 136  K 4.3 4.0  CL 103 103  CO2 23 24  GLUCOSE 227* 203*  BUN 30* 27*  CREATININE 1.48* 1.37*  CALCIUM 8.3* 8.3*   Liver Function Tests:  Recent Labs Lab March 31, 2015 2236 03/06/15 0232  AST 20 21  ALT 13* 15*  ALKPHOS 49 59  BILITOT 0.4 0.2*  PROT 6.1* 6.7  ALBUMIN 2.7* 2.7*   No results for input(s): LIPASE, AMYLASE in the last 168 hours. No results for input(s): AMMONIA in the last 168 hours. CBC:  Recent Labs Lab 03-31-2015 2236 03/06/15 0232  WBC 5.7 5.3  NEUTROABS 3.2  --   HGB 9.2* 9.6*  HCT 28.7* 30.0*  MCV 58.9* 60.0*  PLT 165 158   Cardiac Enzymes: No results for input(s): CKTOTAL, CKMB, CKMBINDEX, TROPONINI in the last 168 hours. BNP (last 3 results) No results for input(s): BNP in the last 8760 hours.  ProBNP (last 3 results) No results for input(s): PROBNP in the last 8760 hours.  CBG:  Recent Labs Lab 03/06/15 0644 03/06/15 1152 03/06/15 1613 03/06/15 2207 03/07/15 0636  GLUCAP 157* 164* 216* 146* 201*    No results found for this or any previous visit (from the past 240 hour(s)).   Studies: Dg Foot Complete Right  March 31, 2015   CLINICAL DATA:  Open wound at the lateral aspect of the right foot. Initial encounter.  EXAM: RIGHT FOOT COMPLETE - 3+ VIEW  COMPARISON:  None.  FINDINGS: There is no evidence of fracture or dislocation. No definite osseous erosions are characterized. The joint spaces are preserved. There is no evidence of talar subluxation; the subtalar joint is unremarkable in appearance. No radiopaque foreign bodies are seen. Pes planus is noted.  The known soft tissue wound is not well characterized. Diffuse dorsal soft tissue swelling is noted at the forefoot, and along the lateral aspect of the foot.  IMPRESSION: 1. No evidence of fracture or dislocation. No definite osseous erosion seen. No radiopaque foreign bodies identified. 2. Pes planus noted.   Electronically Signed   By: Garald Balding M.D.   On:  03/05/2015 23:06    Scheduled Meds: . ciprofloxacin  400 mg Intravenous Q12H  . doxycycline  100 mg Oral Q12H  . heparin  5,000 Units Subcutaneous 3 times per day  . insulin aspart  0-15 Units Subcutaneous TID WC  . insulin aspart  0-5 Units Subcutaneous QHS  . insulin glargine  24 Units Subcutaneous QHS  . lisinopril  40 mg Oral Daily  . pneumococcal 23 valent vaccine  0.5 mL Intramuscular Tomorrow-1000   Continuous Infusions:   Principal Problem:   Puncture wound of foot excluding toes with infection Active Problems:   Diabetic neuropathy   Type 2 diabetes mellitus, uncontrolled   Microcytic anemia   HYPERTENSION, BENIGN ESSENTIAL   Cellulitis of foot, right    Time spent: 35 minutes.     Gordon Young  [facility] Pager [phone] If 7PM-7AM, please contact night-coverage at [email or web address], password [facility] 03/07/2015, 9:35 AM  LOS: 1 day

## 2015-03-07 NOTE — Consult Note (Signed)
WOC wound consult note Reason for Consult: new neuropathic foot ulcer.  Pt has some issues with his hearing and vision.  + Neuropathy in the right foot.  2+ pitting edema in the right foot.  He has normal xray and normal ABI.  He recalls stepping on "a plug".  When further questioned it sounds like the prong end of an electric cord.  He does not report the timing of this.  Wound type: Neuropathic ulcer, full thickness Pressure Ulcer POA:No Measurement:0.5cm x 0.5cm x 1.0cm (undermining) with surrounding hyperkeratosis Wound bed: pink, moist  Drainage (amount, consistency, odor) minimal Periwound:2-3 cm of hyperkeratotic skin, may have been callous at some point Discussed with hospitalist at the bedside, do not feel that acute debridement a need, but follow up in a wound care center would be suggested since this patient has limited vision and neuropathy along with limited resources.  He eventually may need trimming of the hyperkeratotic skin in order for this wound to heal and definitely will need appropriate offloading  Dressing procedure/placement/frequency: iodoform packing strip, to serve as wick of this undermined areas.  Top with dry dressing. Change daily, may need HHRN to monitor and assist with teaching patient to perform wound care.   Appointment made with the outpatient wound care center for Friday July 8th at 9:15am. [place], [place], [place] [address] [facility] [phone]  Discussed POC with patient and bedside nurse.  Re consult if needed, will not follow at this time. Thanks  Gordon Young [place], [place] [phone])

## 2015-03-08 DIAGNOSIS — L03115 Cellulitis of right lower limb: Secondary | ICD-10-CM

## 2015-03-08 DIAGNOSIS — I10 Essential (primary) hypertension: Secondary | ICD-10-CM

## 2015-03-08 LAB — GLUCOSE, CAPILLARY: Glucose-Capillary: 144 mg/dL — ABNORMAL HIGH (ref 65–99)

## 2015-03-08 MED ORDER — DOXYCYCLINE HYCLATE 100 MG PO TABS: 100.0000 mg | ORAL_TABLET | Freq: Two times a day (BID) | ORAL | Status: DC

## 2015-03-08 MED ORDER — INSULIN ASPART 100 UNIT/ML FLEXPEN: 3.0000 [IU] | PEN_INJECTOR | Freq: Three times a day (TID) | SUBCUTANEOUS | Status: DC

## 2015-03-08 MED ORDER — CIPROFLOXACIN HCL 500 MG PO TABS: 500.0000 mg | ORAL_TABLET | Freq: Two times a day (BID) | ORAL | Status: DC

## 2015-03-08 MED ORDER — INSULIN GLARGINE 100 UNIT/ML ~~LOC~~ SOLN: 24.0000 [IU] | Freq: Every day | SUBCUTANEOUS | Status: DC

## 2015-03-08 MED ORDER — CIPROFLOXACIN HCL 500 MG PO TABS
500.0000 mg | ORAL_TABLET | Freq: Two times a day (BID) | ORAL | Status: DC
  Administered 2015-03-08: 500 mg via ORAL
  Filled 2015-03-08: qty 1

## 2015-03-08 MED ORDER — GLUCERNA SHAKE PO LIQD: 237.0000 mL | Freq: Three times a day (TID) | ORAL | Status: DC

## 2015-03-08 NOTE — Progress Notes (Signed)
Orthopedic Tech Progress Note Patient Details:  Gordon Young 1951-06-15 [id number]  Ortho Devices Type of Ortho Device: Postop shoe/boot Ortho Device/Splint Location: RLE Ortho Device/Splint Interventions: Application   Asia R Thompson 03/08/2015, 1:15 PM

## 2015-03-08 NOTE — Care Management (Signed)
Important Message  Patient Details  Name: Gordon Young MRN: [id number] Date of Birth: September 16, 1950   Medicare Important Message Given:  Yes-second notification given    Delorse Lek 03/08/2015, 11:21 AM

## 2015-03-08 NOTE — Care Management Note (Signed)
Case Management Note  Patient Details  Name: Gordon Young MRN: [id number] Date of Birth: August 16, 1951  Subjective/Objective:      Right foot diabetic ulcer              Action/Plan: PT eval- recommended cane, no home therapy recommended. Contacted James with [facility] and requested cane be delivered to patient's room. Spoke with patient, made appointment with [facility] for 03/10/15 at 11:30am. Gave patient appt information. Gave patient [place] letter and list of  participating pharmacies so that he can get new rx filled for $3 each. Spoke with CSW regarding housing cost issues, gave patient phone # for [employer] [facility] [place].                            Expected Discharge Date:                  Expected Discharge Plan:  Home/Self Care  In-House Referral:  Clinical Social Work  Discharge planning Services  CM Consult, [facility], [facility]  Post Acute Care Choice:  Durable Medical Equipment Choice offered to:  Patient  DME Arranged:  Kasandra Knudsen DME Agency:  [place]:    [facility] Agency:     Status of Service:  Completed, signed off  Medicare Important Message Given:  Yes-second notification given Date Medicare IM Given:    Medicare IM give by:    Date Additional Medicare IM Given:    Additional Medicare Important Message give by:     If discussed at [place] of Stay Meetings, dates discussed:    Additional Comments:  Nila Nephew, RN 03/08/2015, 12:05 PM

## 2015-03-08 NOTE — Progress Notes (Signed)
Patient has requested a rolling walker and a cane, 4 prong cane for stability.  Case management is aware of his request for assistance with his medications, that he may be loosing his place of residence, and he has other issues that need to be addressed by the social worker before patient can be discharged.

## 2015-03-08 NOTE — Evaluation (Signed)
Physical Therapy Evaluation Patient Details Name: Gordon Young MRN: [id number] DOB: 08-24-1951 Today's Date: 03/08/2015   History of Present Illness  Patient is a 64 y/o male admitted with Puncture wound of right foot excluding toes with infection. PMH includes HTN, prostate ca, DM, seizures and peripheral neuropathy.  Clinical Impression  Patient presents with balance deficits secondary to wound on right foot. Would benefit from cane for added support during ambulation and to assist with offloading right foot. Recommend post op shoe (MD present in room and placed order). Explained importance of elevation of RLE and exercises for edema management. Pt does not require further skilled therapy services as pt ambulating Mod I. All education completed. Discharge from therapy.     Follow Up Recommendations No PT follow up    Equipment Recommendations  Cane (pt wants 4 prong cane )    Recommendations for Other Services       Precautions / Restrictions Precautions Precautions: Fall Required Braces or Orthoses: Other Brace/Splint Other Brace/Splint: post op shoe - ordered by Dr. Tyrell Antonio while PT in room.  Restrictions Weight Bearing Restrictions: No      Mobility  Bed Mobility Overal bed mobility: Modified Independent                Transfers Overall transfer level: Modified independent Equipment used: None Transfers: Sit to/from Stand Sit to Stand: Modified independent (Device/Increase time)            Ambulation/Gait Ambulation/Gait assistance: Modified independent (Device/Increase time) Ambulation Distance (Feet): 550 Feet Assistive device: None Gait Pattern/deviations: Step-through pattern;Decreased stride length;Drifts right/left   Gait velocity interpretation: Below normal speed for age/gender General Gait Details: Pt drifting right/left but no overt LOB. Lack of BUE arm swing. Would benefit from [facility] for support and to offload RLE.  Stairs             Wheelchair Mobility    Modified Rankin (Stroke Patients Only)       Balance Overall balance assessment: Needs assistance Sitting-balance support: Feet supported;No upper extremity supported Sitting balance-Leahy Scale: Good     Standing balance support: During functional activity Standing balance-Leahy Scale: Fair                               Pertinent Vitals/Pain Pain Assessment: No/denies pain    Home Living Family/patient expects to be discharged to:: Private residence Living Arrangements: Alone   Type of Home: Apartment Home Access: Level entry     Home Layout: One level Home Equipment: None      Prior Function Level of Independence: Independent               Hand Dominance        Extremity/Trunk Assessment   Upper Extremity Assessment: Defer to OT evaluation           Lower Extremity Assessment: Generalized weakness;RLE deficits/detail;LLE deficits/detail RLE Deficits / Details: Swelling present RLE distal to knee.        Communication   Communication: No difficulties  Cognition Arousal/Alertness: Awake/alert Behavior During Therapy: WFL for tasks assessed/performed Overall Cognitive Status: Within Functional Limits for tasks assessed                      General Comments General comments (skin integrity, edema, etc.): Explained importance of elevation of RLE and exericises to reduce swelling. Explained purpose of post op shoe.     Exercises Total Joint  Exercises Ankle Circles/Pumps: Both;20 reps;Seated      Assessment/Plan    PT Assessment Patent does not need any further PT services  PT Diagnosis Generalized weakness   PT Problem List    PT Treatment Interventions     PT Goals (Current goals can be found in the Care Plan section) Acute Rehab PT Goals PT Goal Formulation: All assessment and education complete, DC therapy    Frequency     Barriers to discharge        Co-evaluation                End of Session   Activity Tolerance: Patient tolerated treatment well Patient left: in chair;with call bell/phone within reach Nurse Communication: Mobility status         Time: 1105-1130 PT Time Calculation (min) (ACUTE ONLY): 25 min   Charges:   PT Evaluation $Initial PT Evaluation Tier I: 1 Procedure PT Treatments $Gait Training: 8-22 mins   PT G Codes:        Jakaden Ouzts A Keishon Chavarin 03/08/2015, 11:40 AM  Wray Kearns, PT, DPT [phone]

## 2015-03-08 NOTE — Progress Notes (Signed)
Discharge teaching and prescriptions given to pt, all questions answered. Peripheral IV removed. Post op shoe and cane delivered to room. Waiting on ride.   Raquel James 03/08/2015 2:49 PM

## 2015-03-08 NOTE — Discharge Summary (Signed)
Physician Discharge Summary  Gordon Young JAS:[id number] DOB: 06-Mar-1951 DOA: 03/05/2015  PCP: Mack Hook, MD  Admit date: 03/05/2015 Discharge date: 03/08/2015  Time spent: 35 minutes  Recommendations for Outpatient Follow-up:  Needs to follow up with wound care for local care of wound.   Discharge Diagnoses:    Puncture wound of foot excluding toes with infection   Diabetic neuropathy   Type 2 diabetes mellitus, uncontrolled   Microcytic anemia   HYPERTENSION, BENIGN ESSENTIAL   Cellulitis of foot, right   Discharge Condition: stable  Diet recommendation: heart healthy  Filed Weights   03/05/15 2106 03/06/15 0315  Weight: 81.647 kg (180 lb) 85 kg (187 lb 6.3 oz)    History of present illness:  Gordon Young is a 64 y.o. male with Past medical history of diabetes mellitus type 2, uncontrolled, hypertension, hypogonadism, GERD, peripheral neuropathy. The patient is presenting with complaints of swelling of his legs ongoing since last 3 days. Patient has gradually worsening swelling of the leg associated with pain on the right leg when he tries to ambulate. The pain is located on foot area as well as on the calf area. Pain feels like sharp crampy pain. On his arrival on initial assessment when the RN found that the patient had an open ulcer at which time he initially mentioned that he did not know about it. But now he mentions while going to the bathroom he might have stepped on something. He denies any fever or chills nausea vomiting abdominal pain chest pain diarrhea constipation burning urination. He mentions he is compliant with all his medication. He mentions he last saw his physician in February. He mentions he has chronic neuropathy both his legs as well as his hand.  Hospital Course:  1. Puncture wound of foot excluding toes with infection The patient is presenting with complaints of an open ulcer on the right foot associated with leg pain as well as leg  swelling. With his history of diabetes the patient is at high risk for developing nonhealing ulcer. Continue with doxycycline and cipro.  ESR 71 and CRP 6.1. Wound care consulted. Appreciated recommendation,.  Venous doppler negative for DVT, ABI good blood flow.  X ray no evidence of fracture.  Needs to follow up with wound care center.   2. Diabetes mellitus. hemoglobin A1c at 13.  Continuing insulin 20 units daily at bedtime. Diabetic educator. CM consulted assistance for medications.   3. hypertension. Continuing lisinopril 40. Renal function stable.   4. microcytic anemia. Appears chronic. No active bleeding.   5.Neuropathy.   Procedures:  Doppler negative for DVT  Consultations:    Discharge Exam: Filed Vitals:   03/08/15 0457  BP: 113/61  Pulse: 98  Temp: 98.1 F (36.7 C)  Resp: 16    General: Alert in no distress.  Cardiovascular: S 1, S 2 RRR Respiratory: CTA  Discharge Instructions   Discharge Instructions    Diet Carb Modified    Complete by:  As directed      Increase activity slowly    Complete by:  As directed           Current Discharge Medication List    START taking these medications   Details  ciprofloxacin (CIPRO) 500 MG tablet Take 1 tablet (500 mg total) by mouth 2 (two) times daily. Qty: 14 tablet, Refills: 0    doxycycline (VIBRA-TABS) 100 MG tablet Take 1 tablet (100 mg total) by mouth every 12 (twelve) hours. Qty: 14 tablet, Refills:  0    feeding supplement, GLUCERNA SHAKE, (GLUCERNA SHAKE) LIQD Take 237 mLs by mouth 3 (three) times daily between meals. Qty: 30 Can, Refills: 0      CONTINUE these medications which have CHANGED   Details  insulin aspart (NOVOLOG FLEXPEN) 100 UNIT/ML FlexPen Inject 3 Units into the skin 3 (three) times daily with meals. Qty: 15 mL, Refills: 11    insulin glargine (LANTUS) 100 UNIT/ML injection Inject 0.24 mLs (24 Units total) into the skin at bedtime. Qty: 10 mL, Refills: 11       CONTINUE these medications which have NOT CHANGED   Details  lisinopril (PRINIVIL,ZESTRIL) 40 MG tablet Take 1 tablet (40 mg total) by mouth daily.       No Known Allergies Follow-up Information    Please follow up.   Why:  Appointment made with the outpatient wound care center for Friday July 8th at 9:15am. [place], [place], [place] [address] [facility]       The results of significant diagnostics from this hospitalization (including imaging, microbiology, ancillary and laboratory) are listed below for reference.    Significant Diagnostic Studies: Dg Foot Complete Right  30-Mar-2015   CLINICAL DATA:  Open wound at the lateral aspect of the right foot. Initial encounter.  EXAM: RIGHT FOOT COMPLETE - 3+ VIEW  COMPARISON:  None.  FINDINGS: There is no evidence of fracture or dislocation. No definite osseous erosions are characterized. The joint spaces are preserved. There is no evidence of talar subluxation; the subtalar joint is unremarkable in appearance. No radiopaque foreign bodies are seen. Pes planus is noted.  The known soft tissue wound is not well characterized. Diffuse dorsal soft tissue swelling is noted at the forefoot, and along the lateral aspect of the foot.  IMPRESSION: 1. No evidence of fracture or dislocation. No definite osseous erosion seen. No radiopaque foreign bodies identified. 2. Pes planus noted.   Electronically Signed   By: Garald Balding M.D.   On: 2015/03/30 23:06    Microbiology: No results found for this or any previous visit (from the past 240 hour(s)).   Labs: Basic Metabolic Panel:  Recent Labs Lab 03/30/2015 2236 03/06/15 0232  NA 135 136  K 4.3 4.0  CL 103 103  CO2 23 24  GLUCOSE 227* 203*  BUN 30* 27*  CREATININE 1.48* 1.37*  CALCIUM 8.3* 8.3*   Liver Function Tests:  Recent Labs Lab 30-Mar-2015 2236 03/06/15 0232  AST 20 21  ALT 13* 15*  ALKPHOS 49 59  BILITOT 0.4 0.2*  PROT 6.1* 6.7  ALBUMIN  2.7* 2.7*   No results for input(s): LIPASE, AMYLASE in the last 168 hours. No results for input(s): AMMONIA in the last 168 hours. CBC:  Recent Labs Lab 2015/03/30 2236 03/06/15 0232  WBC 5.7 5.3  NEUTROABS 3.2  --   HGB 9.2* 9.6*  HCT 28.7* 30.0*  MCV 58.9* 60.0*  PLT 165 158   Cardiac Enzymes: No results for input(s): CKTOTAL, CKMB, CKMBINDEX, TROPONINI in the last 168 hours. BNP: BNP (last 3 results) No results for input(s): BNP in the last 8760 hours.  ProBNP (last 3 results) No results for input(s): PROBNP in the last 8760 hours.  CBG:  Recent Labs Lab 03/07/15 0636 03/07/15 1127 03/07/15 1614 03/07/15 2217 03/08/15 1112  GLUCAP 201* 122* 190* 174* 144*       Signed:  Regalado, Belkys A  [facility] 03/08/2015, 11:22 AM

## 2015-03-10 ENCOUNTER — Ambulatory Visit: Payer: Medicare HMO | Attending: Family Medicine | Admitting: Physician Assistant

## 2015-03-10 VITALS — BP 99/64 | HR 91 | Temp 98.5°F | Resp 18 | Ht 67.0 in | Wt 189.4 lb

## 2015-03-10 DIAGNOSIS — S91309D Unspecified open wound, unspecified foot, subsequent encounter: Secondary | ICD-10-CM | POA: Diagnosis present

## 2015-03-10 DIAGNOSIS — Z794 Long term (current) use of insulin: Secondary | ICD-10-CM | POA: Insufficient documentation

## 2015-03-10 DIAGNOSIS — IMO0002 Reserved for concepts with insufficient information to code with codable children: Secondary | ICD-10-CM

## 2015-03-10 DIAGNOSIS — E1165 Type 2 diabetes mellitus with hyperglycemia: Secondary | ICD-10-CM | POA: Insufficient documentation

## 2015-03-10 DIAGNOSIS — S91309A Unspecified open wound, unspecified foot, initial encounter: Secondary | ICD-10-CM | POA: Diagnosis not present

## 2015-03-10 LAB — GLUCOSE, POCT (MANUAL RESULT ENTRY): POC Glucose: 168 mg/dl — AB (ref 70–99)

## 2015-03-10 MED ORDER — "NEEDLE (DISP) 30G X 1"" MISC": 1.0000 | Freq: Three times a day (TID) | Status: AC

## 2015-03-10 MED ORDER — INSULIN ASPART 100 UNIT/ML FLEXPEN: 3.0000 [IU] | PEN_INJECTOR | Freq: Three times a day (TID) | SUBCUTANEOUS | Status: DC

## 2015-03-10 MED ORDER — INSULIN GLARGINE 100 UNIT/ML ~~LOC~~ SOLN: 24.0000 [IU] | Freq: Every day | SUBCUTANEOUS | Status: DC
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
## 2015-03-10 NOTE — Progress Notes (Signed)
Patient here to have his right foot examined and possibly have the bandage changed.  Patient reports he had an infection in his foot after stepping on something about one week ago, and that the infection went all the way up to his calf.  Patient has been in the hospital since last Saturday and was discharged yesterday.  Patient states he feels "alright" today.  Patient complains of "a little bit of pain" in the right foot.  Pain 3/10, constant, dull.    CBG: 168 not fasting

## 2015-03-18 ENCOUNTER — Encounter (HOSPITAL_BASED_OUTPATIENT_CLINIC_OR_DEPARTMENT_OTHER): Payer: Medicare HMO | Attending: Internal Medicine

## 2015-03-21 ENCOUNTER — Telehealth: Payer: Self-pay | Admitting: Internal Medicine

## 2015-03-23 ENCOUNTER — Encounter (HOSPITAL_COMMUNITY): Payer: Self-pay | Admitting: Emergency Medicine

## 2015-03-23 DIAGNOSIS — Z79899 Other long term (current) drug therapy: Secondary | ICD-10-CM | POA: Insufficient documentation

## 2015-03-23 DIAGNOSIS — B351 Tinea unguium: Secondary | ICD-10-CM | POA: Diagnosis not present

## 2015-03-23 DIAGNOSIS — H9193 Unspecified hearing loss, bilateral: Secondary | ICD-10-CM | POA: Diagnosis not present

## 2015-03-23 DIAGNOSIS — Z87438 Personal history of other diseases of male genital organs: Secondary | ICD-10-CM | POA: Diagnosis not present

## 2015-03-23 DIAGNOSIS — Z8739 Personal history of other diseases of the musculoskeletal system and connective tissue: Secondary | ICD-10-CM | POA: Insufficient documentation

## 2015-03-23 DIAGNOSIS — L97519 Non-pressure chronic ulcer of other part of right foot with unspecified severity: Secondary | ICD-10-CM | POA: Insufficient documentation

## 2015-03-23 DIAGNOSIS — Z8719 Personal history of other diseases of the digestive system: Secondary | ICD-10-CM | POA: Insufficient documentation

## 2015-03-23 DIAGNOSIS — Z8546 Personal history of malignant neoplasm of prostate: Secondary | ICD-10-CM | POA: Diagnosis not present

## 2015-03-23 DIAGNOSIS — E119 Type 2 diabetes mellitus without complications: Secondary | ICD-10-CM | POA: Insufficient documentation

## 2015-03-23 DIAGNOSIS — Z87828 Personal history of other (healed) physical injury and trauma: Secondary | ICD-10-CM | POA: Insufficient documentation

## 2015-03-23 DIAGNOSIS — Z794 Long term (current) use of insulin: Secondary | ICD-10-CM | POA: Diagnosis not present

## 2015-03-23 DIAGNOSIS — B353 Tinea pedis: Secondary | ICD-10-CM | POA: Diagnosis not present

## 2015-03-23 DIAGNOSIS — Z973 Presence of spectacles and contact lenses: Secondary | ICD-10-CM | POA: Diagnosis not present

## 2015-03-23 DIAGNOSIS — M79671 Pain in right foot: Secondary | ICD-10-CM | POA: Diagnosis present

## 2015-03-23 DIAGNOSIS — I10 Essential (primary) hypertension: Secondary | ICD-10-CM | POA: Insufficient documentation

## 2015-03-23 NOTE — ED Notes (Signed)
C/o R foot ulcer x 1 week.

## 2015-03-24 ENCOUNTER — Telehealth: Payer: Self-pay | Admitting: Physician Assistant

## 2015-03-24 ENCOUNTER — Encounter (HOSPITAL_COMMUNITY): Payer: Self-pay | Admitting: Emergency Medicine

## 2015-03-24 ENCOUNTER — Emergency Department (HOSPITAL_COMMUNITY)
Admission: EM | Admit: 2015-03-24 | Discharge: 2015-03-24 | Disposition: A | Discharge status: Home / Self Care | Payer: Medicare HMO | Attending: Emergency Medicine | Admitting: Emergency Medicine

## 2015-03-24 ENCOUNTER — Emergency Department (HOSPITAL_COMMUNITY): Payer: Medicare HMO

## 2015-03-24 DIAGNOSIS — B351 Tinea unguium: Secondary | ICD-10-CM

## 2015-03-24 DIAGNOSIS — B999 Unspecified infectious disease: Secondary | ICD-10-CM

## 2015-03-24 DIAGNOSIS — B353 Tinea pedis: Secondary | ICD-10-CM

## 2015-03-24 DIAGNOSIS — L97519 Non-pressure chronic ulcer of other part of right foot with unspecified severity: Secondary | ICD-10-CM

## 2015-03-24 LAB — CBC WITH DIFFERENTIAL/PLATELET
BASOS PCT: 0 % (ref 0–1)
Basophils Absolute: 0 10*3/uL (ref 0.0–0.1)
EOS ABS: 0.3 10*3/uL (ref 0.0–0.7)
Eosinophils Relative: 3 % (ref 0–5)
HCT: 28.6 % — ABNORMAL LOW (ref 39.0–52.0)
Hemoglobin: 9 g/dL — ABNORMAL LOW (ref 13.0–17.0)
LYMPHS ABS: 1.4 10*3/uL (ref 0.7–4.0)
Lymphocytes Relative: 16 % (ref 12–46)
MCH: 18.8 pg — AB (ref 26.0–34.0)
MCHC: 31.5 g/dL (ref 30.0–36.0)
MCV: 59.7 fL — ABNORMAL LOW (ref 78.0–100.0)
MONO ABS: 0.7 10*3/uL (ref 0.1–1.0)
MONOS PCT: 8 % (ref 3–12)
NEUTROS PCT: 73 % (ref 43–77)
Neutro Abs: 6.6 10*3/uL (ref 1.7–7.7)
PLATELETS: 153 10*3/uL (ref 150–400)
RBC: 4.79 MIL/uL (ref 4.22–5.81)
RDW: 18.1 % — AB (ref 11.5–15.5)
WBC: 9 10*3/uL (ref 4.0–10.5)

## 2015-03-24 LAB — I-STAT CHEM 8, ED
BUN: 19 mg/dL (ref 6–20)
CALCIUM ION: 1.27 mmol/L (ref 1.13–1.30)
Chloride: 106 mmol/L (ref 101–111)
Creatinine, Ser: 1.1 mg/dL (ref 0.61–1.24)
GLUCOSE: 220 mg/dL — AB (ref 65–99)
HEMATOCRIT: 30 % — AB (ref 39.0–52.0)
Hemoglobin: 10.2 g/dL — ABNORMAL LOW (ref 13.0–17.0)
POTASSIUM: 4.4 mmol/L (ref 3.5–5.1)
Sodium: 141 mmol/L (ref 135–145)
TCO2: 24 mmol/L (ref 0–100)

## 2015-03-24 MED ORDER — TRAMADOL HCL 50 MG PO TABS: 50.0000 mg | ORAL_TABLET | Freq: Four times a day (QID) | ORAL | Status: DC | PRN

## 2015-03-24 MED ORDER — CLINDAMYCIN HCL 300 MG PO CAPS: 300.0000 mg | ORAL_CAPSULE | Freq: Four times a day (QID) | ORAL | Status: DC

## 2015-03-24 MED ORDER — MUPIROCIN CALCIUM 2 % EX CREA: 1.0000 "application " | TOPICAL_CREAM | Freq: Two times a day (BID) | CUTANEOUS | Status: DC

## 2015-03-24 MED ORDER — DOXYCYCLINE HYCLATE 100 MG PO TABS
100.0000 mg | ORAL_TABLET | Freq: Once | ORAL | Status: AC
  Administered 2015-03-24: 100 mg via ORAL
  Filled 2015-03-24: qty 1

## 2015-03-24 NOTE — Telephone Encounter (Signed)
Patient came into office requesting medication refills on current medications. Patient states he is needing bp medication :lisinopril (PRINIVIL,ZESTRIL) 40 MG tablet. Please f/u with patient

## 2015-03-24 NOTE — ED Notes (Signed)
Patient transported to X-ray 
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
## 2015-03-25 MED ORDER — MUPIROCIN 2 % EX OINT: 1.0000 "application " | TOPICAL_OINTMENT | Freq: Two times a day (BID) | CUTANEOUS | Status: DC

## 2015-04-07 ENCOUNTER — Ambulatory Visit: Payer: Medicare HMO | Attending: Family Medicine | Admitting: Family Medicine

## 2015-04-07 ENCOUNTER — Encounter: Payer: Self-pay | Admitting: Family Medicine

## 2015-04-07 VITALS — BP 139/88 | HR 60 | Temp 98.8°F | Resp 15 | Wt 186.8 lb

## 2015-04-07 DIAGNOSIS — L97519 Non-pressure chronic ulcer of other part of right foot with unspecified severity: Secondary | ICD-10-CM | POA: Insufficient documentation

## 2015-04-07 DIAGNOSIS — E11621 Type 2 diabetes mellitus with foot ulcer: Secondary | ICD-10-CM | POA: Diagnosis not present

## 2015-04-07 DIAGNOSIS — IMO0002 Reserved for concepts with insufficient information to code with codable children: Secondary | ICD-10-CM

## 2015-04-07 DIAGNOSIS — E1165 Type 2 diabetes mellitus with hyperglycemia: Secondary | ICD-10-CM | POA: Insufficient documentation

## 2015-04-07 DIAGNOSIS — D509 Iron deficiency anemia, unspecified: Secondary | ICD-10-CM

## 2015-04-07 DIAGNOSIS — E1121 Type 2 diabetes mellitus with diabetic nephropathy: Secondary | ICD-10-CM | POA: Diagnosis not present

## 2015-04-07 DIAGNOSIS — E119 Type 2 diabetes mellitus without complications: Secondary | ICD-10-CM | POA: Diagnosis not present

## 2015-04-07 DIAGNOSIS — Z794 Long term (current) use of insulin: Secondary | ICD-10-CM | POA: Insufficient documentation

## 2015-04-07 LAB — POCT URINALYSIS DIPSTICK
BILIRUBIN UA: NEGATIVE
GLUCOSE UA: 500
Ketones, UA: NEGATIVE
LEUKOCYTES UA: NEGATIVE
NITRITE UA: NEGATIVE
Protein, UA: 100
Spec Grav, UA: 1.015
UROBILINOGEN UA: 0.2
pH, UA: 5.5

## 2015-04-07 LAB — GLUCOSE, POCT (MANUAL RESULT ENTRY)
POC GLUCOSE: 428 mg/dL — AB (ref 70–99)
POC GLUCOSE: 368 mg/dL — AB (ref 70–99)

## 2015-04-07 LAB — CBC
HCT: 29.3 % — ABNORMAL LOW (ref 39.0–52.0)
Hemoglobin: 8.9 g/dL — ABNORMAL LOW (ref 13.0–17.0)
MCH: 18.6 pg — ABNORMAL LOW (ref 26.0–34.0)
MCHC: 30.4 g/dL (ref 30.0–36.0)
MCV: 61.3 fL — ABNORMAL LOW (ref 78.0–100.0)
PLATELETS: 285 10*3/uL (ref 150–400)
RBC: 4.78 MIL/uL (ref 4.22–5.81)
RDW: 18.8 % — ABNORMAL HIGH (ref 11.5–15.5)
WBC: 7.7 10*3/uL (ref 4.0–10.5)

## 2015-04-07 MED ORDER — ACCU-CHEK AVIVA PLUS W/DEVICE KIT: 1.0000 | PACK | Freq: Three times a day (TID) | Status: DC

## 2015-04-07 MED ORDER — ACCU-CHEK SOFTCLIX LANCET DEV MISC: 1.0000 | Freq: Three times a day (TID) | Status: AC

## 2015-04-07 MED ORDER — INSULIN ASPART 100 UNIT/ML FLEXPEN: 6.0000 [IU] | PEN_INJECTOR | Freq: Three times a day (TID) | SUBCUTANEOUS | Status: DC

## 2015-04-07 MED ORDER — ACCU-CHEK SOFTCLIX LANCETS MISC: 1.0000 | Freq: Three times a day (TID) | Status: DC

## 2015-04-07 MED ORDER — TRAMADOL HCL 50 MG PO TABS: 50.0000 mg | ORAL_TABLET | Freq: Four times a day (QID) | ORAL | Status: DC | PRN

## 2015-04-07 MED ORDER — ACCU-CHEK AVIVA PLUS W/DEVICE KIT: 1.0000 | PACK | Freq: Three times a day (TID) | Status: AC

## 2015-04-07 MED ORDER — INSULIN ASPART 100 UNIT/ML ~~LOC~~ SOLN
20.0000 [IU] | Freq: Once | SUBCUTANEOUS | Status: AC
  Administered 2015-04-07: 20 [IU] via SUBCUTANEOUS

## 2015-04-07 MED ORDER — GLUCOSE BLOOD VI STRP: 1.0000 | ORAL_STRIP | Freq: Three times a day (TID) | Status: AC

## 2015-04-07 MED ORDER — GLUCOSE BLOOD VI STRP: 1.0000 | ORAL_STRIP | Freq: Three times a day (TID) | Status: DC

## 2015-04-07 MED ORDER — ACCU-CHEK SOFTCLIX LANCET DEV MISC: 1.0000 | Freq: Three times a day (TID) | Status: DC

## 2015-04-07 MED ORDER — INSULIN GLARGINE 100 UNIT/ML ~~LOC~~ SOLN: 24.0000 [IU] | Freq: Every day | SUBCUTANEOUS | Status: AC

## 2015-04-07 MED ORDER — ACCU-CHEK SOFTCLIX LANCETS MISC: 1.0000 | Freq: Three times a day (TID) | Status: AC

## 2015-04-07 MED ORDER — AMOXICILLIN-POT CLAVULANATE 875-125 MG PO TABS: 1.0000 | ORAL_TABLET | Freq: Two times a day (BID) | ORAL | Status: DC

## 2015-04-07 MED ORDER — SULFAMETHOXAZOLE-TRIMETHOPRIM 800-160 MG PO TABS: 2.0000 | ORAL_TABLET | Freq: Two times a day (BID) | ORAL | Status: DC

## 2015-04-07 NOTE — Assessment & Plan Note (Signed)
R foot ulcer with uncontrolled diabetes  1. Urgent referral to wound care center placed  2. Ortho referral placed as well  Please have repeat x-ray done Please take 2 antibiotics for next 2 weeks

## 2015-04-07 NOTE — Progress Notes (Signed)
Patient here for follow up on a wound to his right foot Patient stated he had stepped on an extension cord about two weeks Ago and injured the right side of his right foot Old dressing has some blood and puss drainage and the wound does have an odor Patient presents in office with elevated blood sugar and stated he has not taken his medications today

## 2015-04-07 NOTE — Progress Notes (Signed)
   Subjective:    Patient ID: Gordon Young, male    DOB: May 25, 1951, 64 y.o.   MRN: [id number] CC: f/u diabetes type 2, R foot wound  HPI 64 yo M presents for f/u care  1. Diabetes: patient has been diabetic since 1999. Patient has not taken insulin today. He reports he was rushing today to make his appt. He reports that he usually take novolog once a day and lantus once a day. He does not check his sugars because he does not have testing supplies.   2. R foot wound: present since 03/01/2015. Plantar injury occurred while stepping on a cord plug.  Has been treated with cipro, doxy for cellulitis of R foot associated with wound. Foot x-ray x 2 did not reveal osteomyelitis. He was referred to   wound care, but did not keep appt. Went back to ED on 03/24/2015. Was started on clindamycin. He is unsure if he is still taking antibiotics. He did not bring his medications today. He has minimal plantar foot pain. He is drainage  from dorsal wound. He is dressing the wound with toilet paper.   History  Substance Use Topics  . Smoking status: Never Smoker   . Smokeless tobacco: Never Used  . Alcohol Use: No   Review of Systems  Constitutional: Negative for fever, chills, fatigue and unexpected weight change.  Eyes: Negative for visual disturbance.  Respiratory: Negative for cough and shortness of breath.   Cardiovascular: Negative for chest pain, palpitations and leg swelling.  Gastrointestinal: Negative for nausea, vomiting, abdominal pain, diarrhea, constipation and blood in stool.  Musculoskeletal: Negative for myalgias, back pain, arthralgias, gait problem and neck pain.  Skin: Positive for wound. Negative for rash.      Objective:   Physical Exam BP 139/88 mmHg  Pulse 60  Temp(Src) 98.8 F (37.1 C)  Resp 15  Wt 186 lb 12.8 oz (84.732 kg)  SpO2 100% General appearance: alert, cooperative and no distress Lungs: clear to auscultation bilaterally Heart: regular rate and rhythm, S1, S2  normal, no murmur, click, rub or gallop Extremities: edema in R foot with dry and scaly skin. He is has R lateral foot ulcer that is both plantar and dorsal at the MTP joint.   Skin:  The dorsal wound measures 28 mm x 20 mm in diameter with 5 mm of undermining medially and laterally. There is malodorous yellow necrotic tissue that is bubbly  The plantar wound measures 15 mm x 15 mm plantar with surrounding callus   The dorsal was debrided partially of necrotic tissue. Probing did not touch bone. The ulcer is 5-10 cm deep.    Lab Results  Component Value Date   HGBA1C 13.8* 03/06/2015   CBG 428  > 368  After 20 U of novolog     Assessment & Plan:

## 2015-04-07 NOTE — Patient Instructions (Addendum)
Gordon Young,  Thank you for coming in today,  1. R foot ulcer with uncontrolled diabetes  1. Urgent referral to wound care center placed  2. Ortho referral placed as well  Please have repeat x-ray done Please take 2 antibiotics for next 2 weeks   2. Uncontrolled diabetes Get meter and testing supplies Take novolog three times a day Take lantus once a day Check fasting sugar and pre-meal sugar  Diabetes blood sugar goals  Fasting (in AM before breakfast, 8 hrs of no eating or drinking (except water or unsweetened coffee or tea): 90-110 2 hrs after meals: < 160,   No low sugars: nothing < 70   F/u in 2 weeks for wound and blood sugar check  Dr. Adrian Blackwater

## 2015-04-07 NOTE — Assessment & Plan Note (Signed)
Uncontrolled diabetes Get meter and testing supplies Take novolog three times a day Take lantus once a day Check fasting sugar and pre-meal sugar  Diabetes blood sugar goals  Fasting (in AM before breakfast, 8 hrs of no eating or drinking (except water or unsweetened coffee or tea): 90-110 2 hrs after meals: < 160,   No low sugars: nothing < 70

## 2015-04-08 ENCOUNTER — Telehealth: Payer: Self-pay | Admitting: *Deleted

## 2015-04-08 DIAGNOSIS — E1121 Type 2 diabetes mellitus with diabetic nephropathy: Secondary | ICD-10-CM | POA: Insufficient documentation

## 2015-04-08 LAB — MICROALBUMIN / CREATININE URINE RATIO
CREATININE, URINE: 98.2 mg/dL
MICROALB UR: 26.7 mg/dL — AB (ref <2.0)
Microalb Creat Ratio: 271.9 mg/g — ABNORMAL HIGH (ref 0.0–30.0)

## 2015-04-08 MED ORDER — FERROUS SULFATE 325 (65 FE) MG PO TABS: 325.0000 mg | ORAL_TABLET | Freq: Two times a day (BID) | ORAL | Status: AC

## 2015-04-08 NOTE — Assessment & Plan Note (Signed)
Normal WBC but microcytic anemia on CBC, iron ordered

## 2015-04-08 NOTE — Addendum Note (Signed)
Addended by: Boykin Nearing on: 04/08/2015 09:31 AM   Modules accepted: Orders

## 2015-04-08 NOTE — Assessment & Plan Note (Signed)
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

## 2015-04-08 NOTE — Telephone Encounter (Signed)
Unable to contact Pt, no voice mail set up  Rx Tramadol at front office

## 2015-04-11 NOTE — Telephone Encounter (Signed)
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

## 2015-04-11 NOTE — Telephone Encounter (Signed)
Unable to contact Pt  Phone number not available

## 2015-04-28 ENCOUNTER — Ambulatory Visit: Payer: Medicare HMO | Attending: Family Medicine

## 2015-04-28 VITALS — BP 93/60 | HR 102 | Temp 98.5°F | Resp 18 | Ht 67.0 in | Wt 190.0 lb

## 2015-04-28 DIAGNOSIS — L089 Local infection of the skin and subcutaneous tissue, unspecified: Secondary | ICD-10-CM

## 2015-04-28 DIAGNOSIS — Z794 Long term (current) use of insulin: Secondary | ICD-10-CM | POA: Insufficient documentation

## 2015-04-28 DIAGNOSIS — S91331A Puncture wound without foreign body, right foot, initial encounter: Secondary | ICD-10-CM | POA: Diagnosis not present

## 2015-04-28 DIAGNOSIS — E11621 Type 2 diabetes mellitus with foot ulcer: Secondary | ICD-10-CM | POA: Insufficient documentation

## 2015-04-28 DIAGNOSIS — L97519 Non-pressure chronic ulcer of other part of right foot with unspecified severity: Secondary | ICD-10-CM

## 2015-04-28 LAB — GLUCOSE, POCT (MANUAL RESULT ENTRY): POC GLUCOSE: 256 mg/dL — AB (ref 70–99)

## 2015-04-28 MED ORDER — CIPROFLOXACIN HCL 500 MG PO TABS: 500.0000 mg | ORAL_TABLET | Freq: Two times a day (BID) | ORAL | Status: DC

## 2015-04-28 MED ORDER — CLINDAMYCIN HCL 300 MG PO CAPS: 300.0000 mg | ORAL_CAPSULE | Freq: Three times a day (TID) | ORAL | Status: DC

## 2015-04-28 NOTE — Progress Notes (Signed)
Patient here for blood glucose check and wound check. Current blood glucose is 256. Patient last ate at 11am, 4 boiled eggs, can't remember what else.   Patient reports blood glucose in the mornings 180-199. Patient reports blood glucose in the evenings "a little over 200". Patient brought blood glucometer with him, results range from 85-386. Patient does not check sugar daily.  Patient reports having insulin this morning but doesn't remember, maybe it was yesterday.   Patient dropped off refill for Novolog and Lantus, has been out of Novolog for 3 days, has been out of Lantus for 1 day.    Patient takes insulin 3 times a day but is out of insulin now.   Patient has wound on right foot. "It was on the bottom but it moved from the bottom to the top". "I keep washing it and putting clean dressing on it everyday". Wound is approximately the size of a dime, appears to be clean with yellow tissue in wound, edges are pale, red.   Nurse gave patient blood sugar log and gave instructions to check sugar before each meal and write it down. Nurse explained importance of keeping up with documentation. Patient agrees to document blood glucose levels 3 times a day.   Per Dr. Doreene Burke: Dry dressing to right foot and wound culture.  clindamycin 300 TID cipro 500 BID Both for 14 days. Referral to ortho surg and wound care.

## 2015-04-30 LAB — WOUND CULTURE: Gram Stain: NONE SEEN

## 2015-05-06 ENCOUNTER — Ambulatory Visit: Payer: Medicare HMO | Attending: Internal Medicine | Admitting: *Deleted

## 2015-05-06 VITALS — BP 140/84 | HR 92 | Temp 97.7°F | Resp 18 | Ht 67.0 in | Wt 190.2 lb

## 2015-05-06 DIAGNOSIS — E1165 Type 2 diabetes mellitus with hyperglycemia: Secondary | ICD-10-CM | POA: Insufficient documentation

## 2015-05-06 DIAGNOSIS — Z794 Long term (current) use of insulin: Secondary | ICD-10-CM | POA: Insufficient documentation

## 2015-05-06 DIAGNOSIS — Z5189 Encounter for other specified aftercare: Secondary | ICD-10-CM | POA: Diagnosis not present

## 2015-05-06 DIAGNOSIS — IMO0002 Reserved for concepts with insufficient information to code with codable children: Secondary | ICD-10-CM

## 2015-05-06 LAB — POCT CBG (FASTING - GLUCOSE)-MANUAL ENTRY: GLUCOSE FASTING, POC: 150 mg/dL — AB (ref 70–99)

## 2015-05-06 MED ORDER — INSULIN ASPART 100 UNIT/ML FLEXPEN: 8.0000 [IU] | PEN_INJECTOR | Freq: Three times a day (TID) | SUBCUTANEOUS | Status: AC

## 2015-05-06 NOTE — Progress Notes (Signed)
Patient presents for BP check, CBG and record review for T2DM and wound check right foot Med list reviewed; Taking lantus 24 units q HS, novolog 6 units before breakfast and lunch and novolog 10-11 units before dinner. States eats more at dinner so felt he needed more insulin. Thinks he completed augmentin and bactrim prescribed on 04/07/15 for wound right foot. Verified with [facility] pharmacy patient never picked up these RXs. States he has not yet filled RXs for clindamycin and cleocin ordered on 04/28/15 but will pick up and start today. Patient is aware of appt for wound clinic on 05/12/15. Stressed importance of keeping this appt Patient's AM fasting blood sugars ranging 83-185 (6 readings) Patient's before lunch blood sugars ranging 125-185 (6 readings) Patient's before dinner blood sugars ranging 120-293 (6 readings) Denies increased thirst and urination, blurred vision States wound right foot is "much better" Denies pain, swelling at wound site Denies fevers Walking 30-45 minutes 3 times per week for exercise  Wound appears dime sized with white edges. Center of wound with red and yellow tissue. No drainage noted. No redness, swelling or warmth noted around wound.  RN who previously assessed wound brought in to examine wound. States wound appears to be healing with approx 2 mm of new growth at wound edges.  CBG 150 AM fasting  Lab Results  Component Value Date   HGBA1C 13.8* 03/06/2015   Filed Vitals:   05/06/15 1216  BP: 140/84  Pulse: 92  Temp: 97.7 F (36.5 C)  Resp: 18     Per PCP: Increase novolog to 8 units tid AC F/u with PCP in 2 months  Patient aware that he is to f/u with PCP in 2 months. Due 07/08/2015  Patient given literature on Diabetes and Food, Basic Carb Counting, and Diabetes and Foot Care  Patient does not have access to a working computer for [email or web address]

## 2015-05-06 NOTE — Patient Instructions (Addendum)
Mr. Gordon Young, [place] have an appointment [facility] 05-12-15 @ 10AM ph# [phone]  Address: [address] Next to [place] for Diabetes Mellitus Carbohydrate counting is a method for keeping track of the amount of carbohydrates you eat. Eating carbohydrates naturally increases the level of sugar (glucose) in your blood, so it is important for you to know the amount that is okay for you to have in every meal. Carbohydrate counting helps keep the level of glucose in your blood within normal limits. The amount of carbohydrates allowed is different for every person. A dietitian can help you calculate the amount that is right for you. Once you know the amount of carbohydrates you can have, you can count the carbohydrates in the foods you want to eat. Carbohydrates are found in the following foods:  Grains, such as breads and cereals.  Dried beans and soy products.  Starchy vegetables, such as potatoes, peas, and corn.  Fruit and fruit juices.  Milk and yogurt.  Sweets and snack foods, such as cake, cookies, candy, chips, soft drinks, and fruit drinks. CARBOHYDRATE COUNTING There are two ways to count the carbohydrates in your food. You can use either of the methods or a combination of both. Reading the "Nutrition Facts" on [place] The "Nutrition Facts" is an area that is included on the labels of almost all packaged food and beverages in the [place]. It includes the serving size of that food or beverage and information about the nutrients in each serving of the food, including the grams (g) of carbohydrate per serving.  Decide the number of servings of this food or beverage that you will be able to eat or drink. Multiply that number of servings by the number of grams of carbohydrate that is listed on the label for that serving. The total will be the amount of carbohydrates you will be having when you eat or drink  this food or beverage. Learning Standard Serving Sizes of Food When you eat food that is not packaged or does not include "Nutrition Facts" on the label, you need to measure the servings in order to count the amount of carbohydrates.A serving of most carbohydrate-rich foods contains about 15 g of carbohydrates. The following list includes serving sizes of carbohydrate-rich foods that provide 15 g ofcarbohydrate per serving:   1 slice of bread (1 oz) or 1 six-inch tortilla.    of a hamburger bun or English muffin.  4-6 crackers.   cup unsweetened dry cereal.    cup hot cereal.   cup rice or pasta.    cup mashed potatoes or  of a large baked potato.  1 cup fresh fruit or one small piece of fruit.    cup canned or frozen fruit or fruit juice.  1 cup milk.   cup plain fat-free yogurt or yogurt sweetened with artificial sweeteners.   cup cooked dried beans or starchy vegetable, such as peas, corn, or potatoes.  Decide the number of standard-size servings that you will eat. Multiply that number of servings by 15 (the grams of carbohydrates in that serving). For example, if you eat 2 cups of strawberries, you will have eaten 2 servings and 30 g of carbohydrates (2 servings x 15 g = 30 g). For foods such as soups and casseroles, in which more than one food is mixed in, you will need to count the carbohydrates in each food that  is included. EXAMPLE OF CARBOHYDRATE COUNTING Sample Dinner  3 oz chicken breast.   cup of brown rice.   cup of corn.  1 cup milk.   1 cup strawberries with sugar-free whipped topping.  Carbohydrate Calculation Step 1: Identify the foods that contain carbohydrates:   Rice.   Corn.   Milk.   Strawberries. Step 2:Calculate the number of servings eaten of each:   2 servings of rice.   1 serving of corn.   1 serving of milk.   1 serving of strawberries. Step 3: Multiply each of those number of servings by 15 g:   2  servings of rice x 15 g = 30 g.   1 serving of corn x 15 g = 15 g.   1 serving of milk x 15 g = 15 g.   1 serving of strawberries x 15 g = 15 g. Step 4: Add together all of the amounts to find the total grams of carbohydrates eaten: 30 g + 15 g + 15 g + 15 g = 75 g. Document Released: 08/27/2005 Document Revised: 01/11/2014 Document Reviewed: 07/24/2013 [facility] Patient Information 2015 [place], [place]. This information is not intended to replace advice given to you by your health care provider. Make sure you discuss any questions you have with your health care provider. Diabetes Mellitus and Food It is important for you to manage your blood sugar (glucose) level. Your blood glucose level can be greatly affected by what you eat. Eating healthier foods in the appropriate amounts throughout the day at about the same time each day will help you control your blood glucose level. It can also help slow or prevent worsening of your diabetes mellitus. Healthy eating may even help you improve the level of your blood pressure and reach or maintain a healthy weight.  HOW CAN FOOD AFFECT ME? Carbohydrates Carbohydrates affect your blood glucose level more than any other type of food. Your dietitian will help you determine how many carbohydrates to eat at each meal and teach you how to count carbohydrates. Counting carbohydrates is important to keep your blood glucose at a healthy level, especially if you are using insulin or taking certain medicines for diabetes mellitus. Alcohol Alcohol can cause sudden decreases in blood glucose (hypoglycemia), especially if you use insulin or take certain medicines for diabetes mellitus. Hypoglycemia can be a life-threatening condition. Symptoms of hypoglycemia (sleepiness, dizziness, and disorientation) are similar to symptoms of having too much alcohol.  If your health care provider has given you approval to drink alcohol, do so in moderation and use the following  guidelines:  Women should not have more than one drink per day, and men should not have more than two drinks per day. One drink is equal to:  12 oz of beer.  5 oz of wine.  1 oz of hard liquor.  Do not drink on an empty stomach.  Keep yourself hydrated. Have water, diet soda, or unsweetened iced tea.  Regular soda, juice, and other mixers might contain a lot of carbohydrates and should be counted. WHAT FOODS ARE NOT RECOMMENDED? As you make food choices, it is important to remember that all foods are not the same. Some foods have fewer nutrients per serving than other foods, even though they might have the same number of calories or carbohydrates. It is difficult to get your body what it needs when you eat foods with fewer nutrients. Examples of foods that you should avoid that are high in calories  and carbohydrates but low in nutrients include:  Trans fats (most processed foods list trans fats on the Nutrition Facts label).  Regular soda.  Juice.  Candy.  Sweets, such as cake, pie, doughnuts, and cookies.  Fried foods. WHAT FOODS CAN I EAT? Have nutrient-rich foods, which will nourish your body and keep you healthy. The food you should eat also will depend on several factors, including:  The calories you need.  The medicines you take.  Your weight.  Your blood glucose level.  Your blood pressure level.  Your cholesterol level. You also should eat a variety of foods, including:  Protein, such as meat, poultry, fish, tofu, nuts, and seeds (lean animal proteins are best).  Fruits.  Vegetables.  Dairy products, such as milk, cheese, and yogurt (low fat is best).  Breads, grains, pasta, cereal, rice, and beans.  Fats such as olive oil, trans fat-free margarine, canola oil, avocado, and olives. DOES EVERYONE WITH DIABETES MELLITUS HAVE THE SAME MEAL PLAN? Because every person with diabetes mellitus is different, there is not one meal plan that works for everyone.  It is very important that you meet with a dietitian who will help you create a meal plan that is just right for you. Document Released: 05/24/2005 Document Revised: 09/01/2013 Document Reviewed: 07/24/2013 [facility] Patient Information 2015 [place], [place]. This information is not intended to replace advice given to you by your health care provider. Make sure you discuss any questions you have with your health care provider. Diabetes and Foot Care Diabetes may cause you to have problems because of poor blood supply (circulation) to your feet and legs. This may cause the skin on your feet to become thinner, break easier, and heal more slowly. Your skin may become dry, and the skin may peel and crack. You may also have nerve damage in your legs and feet causing decreased feeling in them. You may not notice minor injuries to your feet that could lead to infections or more serious problems. Taking care of your feet is one of the most important things you can do for yourself.  HOME CARE INSTRUCTIONS  Wear shoes at all times, even in the house. Do not go barefoot. Bare feet are easily injured.  Check your feet daily for blisters, cuts, and redness. If you cannot see the bottom of your feet, use a mirror or ask someone for help.  Wash your feet with warm water (do not use hot water) and mild soap. Then pat your feet and the areas between your toes until they are completely dry. Do not soak your feet as this can dry your skin.  Apply a moisturizing lotion or petroleum jelly (that does not contain alcohol and is unscented) to the skin on your feet and to dry, brittle toenails. Do not apply lotion between your toes.  Trim your toenails straight across. Do not dig under them or around the cuticle. File the edges of your nails with an emery board or nail file.  Do not cut corns or calluses or try to remove them with medicine.  Wear clean socks or stockings every day. Make sure they are not too tight. Do not  wear knee-high stockings since they may decrease blood flow to your legs.  Wear shoes that fit properly and have enough cushioning. To break in new shoes, wear them for just a few hours a day. This prevents you from injuring your feet. Always look in your shoes before you put them on to be sure  there are no objects inside.  Do not cross your legs. This may decrease the blood flow to your feet.  If you find a minor scrape, cut, or break in the skin on your feet, keep it and the skin around it clean and dry. These areas may be cleansed with mild soap and water. Do not cleanse the area with peroxide, alcohol, or iodine.  When you remove an adhesive bandage, be sure not to damage the skin around it.  If you have a wound, look at it several times a day to make sure it is healing.  Do not use heating pads or hot water bottles. They may burn your skin. If you have lost feeling in your feet or legs, you may not know it is happening until it is too late.  Make sure your health care provider performs a complete foot exam at least annually or more often if you have foot problems. Report any cuts, sores, or bruises to your health care provider immediately. SEEK MEDICAL CARE IF:   You have an injury that is not healing.  You have cuts or breaks in the skin.  You have an ingrown nail.  You notice redness on your legs or feet.  You feel burning or tingling in your legs or feet.  You have pain or cramps in your legs and feet.  Your legs or feet are numb.  Your feet always feel cold. SEEK IMMEDIATE MEDICAL CARE IF:   There is increasing redness, swelling, or pain in or around a wound.  There is a red line that goes up your leg.  Pus is coming from a wound.  You develop a fever or as directed by your health care provider.  You notice a bad smell coming from an ulcer or wound. Document Released: 08/24/2000 Document Revised: 04/29/2013 Document Reviewed: 02/03/2013 [facility] Patient  Information 2015 [place], [place]. This information is not intended to replace advice given to you by your health care provider. Make sure you discuss any questions you have with your health care provider.

## 2015-05-09 ENCOUNTER — Telehealth: Payer: Self-pay | Admitting: Clinical

## 2015-05-09 ENCOUNTER — Telehealth: Payer: Self-pay | Admitting: *Deleted

## 2015-05-09 NOTE — Telephone Encounter (Signed)
Attempted to reach patient, however, home number was busy X 2 and son's mobile number states "unavailable" Patient left blood sugar log at last nurse visit. Will file in front office for patient to pick up at his convenience.

## 2015-05-09 NOTE — Telephone Encounter (Signed)
Phone line busy, attempt to f/u with pt, no message

## 2015-05-11 NOTE — Telephone Encounter (Signed)
Second attempt to reach patient. Home phone is busy and son's phone states "unavailable"

## 2015-05-12 ENCOUNTER — Encounter (HOSPITAL_BASED_OUTPATIENT_CLINIC_OR_DEPARTMENT_OTHER): Payer: Medicare HMO | Attending: General Surgery

## 2015-05-12 DIAGNOSIS — Z923 Personal history of irradiation: Secondary | ICD-10-CM | POA: Diagnosis not present

## 2015-05-12 DIAGNOSIS — E1140 Type 2 diabetes mellitus with diabetic neuropathy, unspecified: Secondary | ICD-10-CM | POA: Insufficient documentation

## 2015-05-12 DIAGNOSIS — M1990 Unspecified osteoarthritis, unspecified site: Secondary | ICD-10-CM | POA: Diagnosis not present

## 2015-05-12 DIAGNOSIS — I10 Essential (primary) hypertension: Secondary | ICD-10-CM | POA: Insufficient documentation

## 2015-05-12 DIAGNOSIS — E11621 Type 2 diabetes mellitus with foot ulcer: Secondary | ICD-10-CM | POA: Diagnosis present

## 2015-05-12 DIAGNOSIS — Z794 Long term (current) use of insulin: Secondary | ICD-10-CM | POA: Insufficient documentation

## 2015-05-12 DIAGNOSIS — L97811 Non-pressure chronic ulcer of other part of right lower leg limited to breakdown of skin: Secondary | ICD-10-CM | POA: Diagnosis not present

## 2015-05-19 DIAGNOSIS — E1140 Type 2 diabetes mellitus with diabetic neuropathy, unspecified: Secondary | ICD-10-CM | POA: Diagnosis not present

## 2015-05-19 DIAGNOSIS — Z923 Personal history of irradiation: Secondary | ICD-10-CM | POA: Diagnosis not present

## 2015-05-19 DIAGNOSIS — L97811 Non-pressure chronic ulcer of other part of right lower leg limited to breakdown of skin: Secondary | ICD-10-CM | POA: Diagnosis not present

## 2015-05-19 DIAGNOSIS — E11621 Type 2 diabetes mellitus with foot ulcer: Secondary | ICD-10-CM | POA: Diagnosis not present

## 2015-06-02 DIAGNOSIS — E11621 Type 2 diabetes mellitus with foot ulcer: Secondary | ICD-10-CM | POA: Diagnosis not present

## 2015-06-02 DIAGNOSIS — Z923 Personal history of irradiation: Secondary | ICD-10-CM | POA: Diagnosis not present

## 2015-06-02 DIAGNOSIS — E1140 Type 2 diabetes mellitus with diabetic neuropathy, unspecified: Secondary | ICD-10-CM | POA: Diagnosis not present

## 2015-06-02 DIAGNOSIS — L97811 Non-pressure chronic ulcer of other part of right lower leg limited to breakdown of skin: Secondary | ICD-10-CM | POA: Diagnosis not present

## 2015-06-09 DIAGNOSIS — E11621 Type 2 diabetes mellitus with foot ulcer: Secondary | ICD-10-CM | POA: Diagnosis not present

## 2015-06-09 DIAGNOSIS — Z923 Personal history of irradiation: Secondary | ICD-10-CM | POA: Diagnosis not present

## 2015-06-09 DIAGNOSIS — L97811 Non-pressure chronic ulcer of other part of right lower leg limited to breakdown of skin: Secondary | ICD-10-CM | POA: Diagnosis not present

## 2015-06-09 DIAGNOSIS — E1140 Type 2 diabetes mellitus with diabetic neuropathy, unspecified: Secondary | ICD-10-CM | POA: Diagnosis not present

## 2015-06-16 ENCOUNTER — Encounter (HOSPITAL_BASED_OUTPATIENT_CLINIC_OR_DEPARTMENT_OTHER): Payer: Medicare HMO | Attending: Internal Medicine

## 2015-06-16 DIAGNOSIS — E1140 Type 2 diabetes mellitus with diabetic neuropathy, unspecified: Secondary | ICD-10-CM | POA: Diagnosis not present

## 2015-06-16 DIAGNOSIS — Z923 Personal history of irradiation: Secondary | ICD-10-CM | POA: Diagnosis not present

## 2015-06-16 DIAGNOSIS — L97511 Non-pressure chronic ulcer of other part of right foot limited to breakdown of skin: Secondary | ICD-10-CM | POA: Insufficient documentation

## 2015-06-16 DIAGNOSIS — E11621 Type 2 diabetes mellitus with foot ulcer: Secondary | ICD-10-CM | POA: Diagnosis present

## 2015-06-16 DIAGNOSIS — G40909 Epilepsy, unspecified, not intractable, without status epilepticus: Secondary | ICD-10-CM | POA: Diagnosis not present

## 2015-06-16 DIAGNOSIS — M1990 Unspecified osteoarthritis, unspecified site: Secondary | ICD-10-CM | POA: Diagnosis not present

## 2015-06-16 DIAGNOSIS — I10 Essential (primary) hypertension: Secondary | ICD-10-CM | POA: Insufficient documentation

## 2015-06-22 DIAGNOSIS — E11621 Type 2 diabetes mellitus with foot ulcer: Secondary | ICD-10-CM | POA: Diagnosis not present

## 2015-06-30 DIAGNOSIS — E11621 Type 2 diabetes mellitus with foot ulcer: Secondary | ICD-10-CM | POA: Diagnosis not present

## 2015-06-30 DIAGNOSIS — L97511 Non-pressure chronic ulcer of other part of right foot limited to breakdown of skin: Secondary | ICD-10-CM | POA: Diagnosis not present

## 2015-06-30 DIAGNOSIS — I10 Essential (primary) hypertension: Secondary | ICD-10-CM | POA: Diagnosis not present

## 2015-06-30 DIAGNOSIS — M1990 Unspecified osteoarthritis, unspecified site: Secondary | ICD-10-CM | POA: Diagnosis not present

## 2015-07-08 ENCOUNTER — Ambulatory Visit: Payer: Medicare HMO | Admitting: Family Medicine

## 2015-07-11 ENCOUNTER — Telehealth: Payer: Self-pay

## 2015-07-11 NOTE — Telephone Encounter (Signed)
Nurse called patient to check on patient. Home number rings fast busy signal. Mobile number goes straight to recording explaining person is not available at this time, please try again later.

## 2015-07-12 ENCOUNTER — Encounter: Payer: Self-pay | Admitting: Family Medicine

## 2015-07-12 ENCOUNTER — Ambulatory Visit: Payer: Medicare HMO | Attending: Family Medicine | Admitting: Family Medicine

## 2015-07-12 VITALS — BP 130/82 | HR 90 | Temp 98.2°F | Resp 16 | Ht 67.0 in | Wt 194.0 lb

## 2015-07-12 DIAGNOSIS — H6122 Impacted cerumen, left ear: Secondary | ICD-10-CM | POA: Diagnosis not present

## 2015-07-12 DIAGNOSIS — E118 Type 2 diabetes mellitus with unspecified complications: Secondary | ICD-10-CM

## 2015-07-12 DIAGNOSIS — Z0000 Encounter for general adult medical examination without abnormal findings: Secondary | ICD-10-CM

## 2015-07-12 DIAGNOSIS — Z794 Long term (current) use of insulin: Secondary | ICD-10-CM | POA: Diagnosis not present

## 2015-07-12 LAB — POCT GLYCOSYLATED HEMOGLOBIN (HGB A1C): Hemoglobin A1C: 7.3

## 2015-07-12 LAB — GLUCOSE, POCT (MANUAL RESULT ENTRY): POC Glucose: 104 mg/dl — AB (ref 70–99)

## 2015-07-12 MED ORDER — ZOSTER VACCINE LIVE 19400 UNT/0.65ML ~~LOC~~ SOLR: 0.6500 mL | Freq: Once | SUBCUTANEOUS | Status: DC

## 2015-07-12 MED ORDER — CARBAMIDE PEROXIDE 6.5 % OT SOLN: 5.0000 [drp] | Freq: Two times a day (BID) | OTIC | Status: DC

## 2015-07-12 NOTE — Progress Notes (Signed)
Patient ID: Gordon Young, male   DOB: May 29, 1951, 64 y.o.   MRN: [id number]   Subjective:  Patient ID: Gordon Young, male    DOB: 05-Jan-1951  Age: 64 y.o. MRN: [id number]  CC: Diabetes   HPI Gordon Young presents for    1. CHRONIC DIABETES  Disease Monitoring  Blood Sugar Ranges: 98-130  Polyuria: no   Visual problems: no   Medication Compliance: yes  Medication Side Effects  Hypoglycemia: no   Preventitive Health Care  Eye Exam: due   Foot Exam: done today   Diet pattern:     2. R foot ulcer: healed. He was seeing podiatry weekly. He no longer needs to f/u.  3. Hearing loss: L ear. No pain or dizziness. No ringing in ears.   4. HM: amenable to flu shot. He has not had chicken pox and unsure of immunization status.   Social History  Substance Use Topics  . Smoking status: Never Smoker   . Smokeless tobacco: Never Used  . Alcohol Use: No    Outpatient Prescriptions Prior to Visit  Medication Sig Dispense Refill  . ACCU-CHEK SOFTCLIX LANCETS lancets 1 each by Other route 3 (three) times daily. 100 each 12  . Blood Glucose Monitoring Suppl (ACCU-CHEK AVIVA PLUS) W/DEVICE KIT 1 Device by Does not apply route 3 (three) times daily after meals. B11.65 1 kit 0  . feeding supplement, GLUCERNA SHAKE, (GLUCERNA SHAKE) LIQD Take 237 mLs by mouth 3 (three) times daily between meals. 30 Can 0  . ferrous sulfate 325 (65 FE) MG tablet Take 1 tablet (325 mg total) by mouth 2 (two) times daily with a meal. 60 tablet 3  . glucose blood (ACCU-CHEK AVIVA PLUS) test strip 1 each by Other route 3 (three) times daily. 100 each 12  . insulin aspart (NOVOLOG FLEXPEN) 100 UNIT/ML FlexPen Inject 8 Units into the skin 3 (three) times daily with meals. 15 mL 11  . insulin glargine (LANTUS) 100 UNIT/ML injection Inject 0.24 mLs (24 Units total) into the skin at bedtime. 10 mL 11  . Lancet Devices (ACCU-CHEK SOFTCLIX) lancets 1 each by Other route 3 (three) times daily. 1 each 0  . lisinopril  (PRINIVIL,ZESTRIL) 40 MG tablet Take 1 tablet (40 mg total) by mouth daily.    Marland Kitchen NEEDLE, DISP, 30 G (B-D DISP NEEDLE 30GX1") 30G X 1" MISC 1 each by Does not apply route 3 (three) times daily. 90 each 5  . amoxicillin-clavulanate (AUGMENTIN) 875-125 MG per tablet Take 1 tablet by mouth 2 (two) times daily. (Patient not taking: Reported on 04/28/2015) 28 tablet 0  . ciprofloxacin (CIPRO) 500 MG tablet Take 1 tablet (500 mg total) by mouth 2 (two) times daily. (Patient not taking: Reported on 05/06/2015) 28 tablet 0  . clindamycin (CLEOCIN) 300 MG capsule Take 1 capsule (300 mg total) by mouth 3 (three) times daily. (Patient not taking: Reported on 05/06/2015) 42 capsule 0  . mupirocin cream (BACTROBAN) 2 % Apply 1 application topically 2 (two) times daily. (Patient not taking: Reported on 07/12/2015) 15 g 0  . mupirocin ointment (BACTROBAN) 2 % Place 1 application into the nose 2 (two) times daily. Applied to affected area twice daily. (Patient not taking: Reported on 07/12/2015) 22 g 0  . sulfamethoxazole-trimethoprim (BACTRIM DS,SEPTRA DS) 800-160 MG per tablet Take 2 tablets by mouth 2 (two) times daily. (Patient not taking: Reported on 04/28/2015) 56 tablet 0  . traMADol (ULTRAM) 50 MG tablet Take 1 tablet (50 mg  total) by mouth every 6 (six) hours as needed. (Patient not taking: Reported on 04/28/2015) 30 tablet 0   No facility-administered medications prior to visit.    ROS Review of Systems  Constitutional: Negative for fever, chills, fatigue and unexpected weight change.  HENT: Positive for hearing loss (R ear ).   Eyes: Negative for visual disturbance.  Respiratory: Negative for cough and shortness of breath.   Cardiovascular: Negative for chest pain, palpitations and leg swelling.  Gastrointestinal: Negative for nausea, vomiting, abdominal pain, diarrhea, constipation and blood in stool.  Endocrine: Negative for polydipsia, polyphagia and polyuria.  Musculoskeletal: Negative for myalgias,  back pain, arthralgias, gait problem and neck pain.  Skin: Negative for rash.  Allergic/Immunologic: Negative for immunocompromised state.  Hematological: Negative for adenopathy. Does not bruise/bleed easily.  Psychiatric/Behavioral: Negative for suicidal ideas, sleep disturbance and dysphoric mood. The patient is not nervous/anxious.     Objective:  BP 130/82 mmHg  Pulse 90  Temp(Src) 98.2 F (36.8 C) (Oral)  Resp 16  Ht _0  (1.702 m)  Wt 194 lb (87.998 kg)  BMI 30.38 kg/m2  SpO2 100%  BP/Weight 07/12/2015 05/06/2015 2/42/6834  Systolic BP 196 222 93  Diastolic BP 82 84 60  Wt. (Lbs) 194 190.2 190  BMI 30.38 29.78 29.75    Physical Exam  Constitutional: He appears well-developed and well-nourished. No distress.  HENT:  Head: Normocephalic and atraumatic.  Left Ear: Left ear middle ear effusion: wax in L ear   Neck: Normal range of motion. Neck supple.  Cardiovascular: Normal rate, regular rhythm, normal heart sounds and intact distal pulses.   Pulmonary/Chest: Effort normal and breath sounds normal.  Musculoskeletal: He exhibits no edema.  Neurological: He is alert.  Skin: Skin is warm and dry. No rash noted. No erythema.  Psychiatric: He has a normal mood and affect.    Lab Results  Component Value Date   HGBA1C 7.30 07/12/2015   CBG 104  Assessment & Plan:   Problem List Items Addressed This Visit    Diabetes mellitus type 2, controlled, with complications ([place]) - Primary   Relevant Orders   POCT glycosylated hemoglobin (Hb A1C) (Completed)   POCT glucose (manual entry) (Completed)   Ambulatory referral to Ophthalmology   Impacted cerumen of left ear   Relevant Medications   carbamide peroxide (DEBROX) 6.5 % otic solution    Other Visit Diagnoses    Healthcare maintenance        Relevant Orders    Flu Vaccine QUAD 36+ mos IM (Completed)    Varicella zoster antibody, IgG    Varicella zoster antibody, IgM       No orders of the defined types were  placed in this encounter.    Follow-up: No Follow-up on file.   Boykin Nearing MD

## 2015-07-12 NOTE — Progress Notes (Signed)
F/U DM, ulcer on rt foot No pain today  Glucose running about 98 this morning  Pt fasting today, No insulin today.

## 2015-07-12 NOTE — Patient Instructions (Addendum)
Gordon Young was seen today for diabetes.  Diagnoses and all orders for this visit:  Controlled type 2 diabetes mellitus with complication, with long-term current use of insulin (HCC) -     POCT glycosylated hemoglobin (Hb A1C) -     POCT glucose (manual entry) -     Ambulatory referral to Ophthalmology  Impacted cerumen of left ear -     carbamide peroxide (DEBROX) 6.5 % otic solution; Place 5 drops into the left ear 2 (two) times daily. For 5 days  Healthcare maintenance -     Flu Vaccine QUAD 36+ mos IM -     Varicella Zoster Abs, IgG/IgM -     Discontinue: zoster vaccine live, PF, (ZOSTAVAX) 15726 UNT/0.65ML injection; Inject 19,400 Units into the skin once.    Will get varicella titers today to determine if need to be vaccinated against chicken pox or shingles    F/u in 3 months for daibetes  Dr. Adrian Blackwater

## 2015-07-13 LAB — VARICELLA ZOSTER ANTIBODY, IGG: VARICELLA IGG: 2535 {index} — AB (ref <135.00)

## 2015-07-15 ENCOUNTER — Telehealth: Payer: Self-pay | Admitting: *Deleted

## 2015-07-15 DIAGNOSIS — Z0000 Encounter for general adult medical examination without abnormal findings: Secondary | ICD-10-CM | POA: Insufficient documentation

## 2015-07-15 LAB — VARICELLA ZOSTER ANTIBODY, IGM: Varicella Zoster Ab IgM: 0 {ISR} (ref <0.91)

## 2015-07-15 MED ORDER — ZOSTER VACCINE LIVE 19400 UNT/0.65ML ~~LOC~~ SOLR: 0.6500 mL | Freq: Once | SUBCUTANEOUS | Status: DC

## 2015-07-15 NOTE — Addendum Note (Signed)
Addended by: Boykin Nearing on: 07/15/2015 01:41 PM   Modules accepted: Orders

## 2015-07-15 NOTE — Assessment & Plan Note (Signed)
Patient is immune to varicella, chicken pox I do recommend shingles vaccination. Rx is available for pick up

## 2015-07-21 ENCOUNTER — Encounter (HOSPITAL_BASED_OUTPATIENT_CLINIC_OR_DEPARTMENT_OTHER): Payer: Medicare HMO | Attending: Internal Medicine

## 2015-07-27 NOTE — Telephone Encounter (Signed)
-----   Message from Boykin Nearing, MD sent at 07/15/2015  1:39 PM EDT ----- Patient is immune to varicella, chicken pox I do recommend shingles vaccination. Rx is available for pick up

## 2015-07-27 NOTE — Telephone Encounter (Signed)
Unable to contact Pt "number not available"

## 2015-08-01 NOTE — Telephone Encounter (Signed)
Rx Zostavax mail to pt

## 2015-08-16 LAB — HM DIABETES EYE EXAM

## 2015-08-25 LAB — HM DIABETES EYE EXAM

## 2015-08-30 ENCOUNTER — Encounter: Payer: Self-pay | Admitting: Family Medicine

## 2015-08-30 DIAGNOSIS — E11311 Type 2 diabetes mellitus with unspecified diabetic retinopathy with macular edema: Secondary | ICD-10-CM | POA: Insufficient documentation

## 2015-12-19 ENCOUNTER — Encounter: Payer: Self-pay | Admitting: Radiation Oncology

## 2015-12-19 NOTE — Progress Notes (Signed)
Patient presented to the clinic without an appointment requesting to be seen by Dr. Tammi Klippel. Explained Dr. Tammi Klippel is seeing other patient's presently and inquired of his need. Patient states, "my cancer is back and I need Dr. Johny Shears help..I don't have much more time left."Patient provided this RN with a CD from [place] of his skull. Patient reports his PCP found wax in his ear and sent him for the skull scan. Patient states, "I told her it wasn't wax it was cancer." Patient denies headache, dizziness, nausea, vomiting, diplopia or ringing in the ears. Patient is alert and oriented x 3. Patient reports he lives with his son and rode the bus here. Patient smelled of urine. Explained to the patient this RN will inform Dr. Tammi Klippel of these findings and provided him with the disc to review. Explained this RN would be in touch via phone in the near future to advise. Patient provided me with [phone] and [phone] as points of contact. Patient left ambulatory without any signs of distress. [facility] reports a last know PSA of 0.690 drawn on 08/10/14. Per Dr. Tammi Klippel the skull scan revealed no abnormalities. Last note from [place] was 12/04/2013. Currently awaiting medical records from Boykin Nearing, MD at [facility] [phone]) the patient's last known PCP. Phoned patient to inform him of findings. He goes onto to say he was last seen by [facility] at [place] [phone]. Phoned that office and requested records. Patient understands this RN will call him back once all records are obtained. Patient requested address and office phone for Dr. Karsten Ro. Information provided.

## 2015-12-20 ENCOUNTER — Encounter: Payer: Self-pay | Admitting: Radiation Oncology

## 2015-12-20 NOTE — Progress Notes (Signed)
Able to reach [place] at [facility] office. She reports the patient is new to their office and only seen since January of 2017. Dianna is willing to fax records with signed medical release. Phoned patient to inquire if he will be in the area today and if he has made an appointment with [facility]. No answer. Left message. Patient presented to the clinic requesting to pick up disc of skull scan he dropped off yesterday. Returned disc to patient. Patient signed medical release. Patient reports he has an appointment with [facility] tomorrow. Faxed release to Dianna at [phone]. Confirmation fax of delivery obtained. Patient understands this RN will contact him once all records have been obtained and reviewed. Phoned [facility] spoke with Bethena Roys in medical records and requested Dr. Tammi Klippel be cc'ed on patient's notes for visit tomorrow.

## 2015-12-28 ENCOUNTER — Telehealth: Payer: Self-pay | Admitting: *Deleted

## 2015-12-28 ENCOUNTER — Telehealth: Payer: Self-pay | Admitting: Radiation Oncology

## 2015-12-28 NOTE — Telephone Encounter (Signed)
-----   Message from Tyler Pita, MD sent at 12/28/2015 10:41 AM EDT ----- Regarding: RE: Radiation ??? I reviewed.  Skull x-ray is normal.  PSA is low.  I am not sure what his concerns are.  If you can set up a follow-up I am happy to see him.    ----- Message -----    From: Heywood Footman, RN    Sent: 12/27/2015   8:52 AM      To: Tyler Pita, MD Subject: RE: Radiation ???                              Sir. I placed the packet of paperwork I received on your desk to review. Once you have reviewed this please let me know what to do next. Thank you. Sam ----- Message -----    From: Tyler Pita, MD    Sent: 12/26/2015   5:00 PM      To: Hayden Pedro, PA-C, # Subject: RE: Radiation ???                              I reviewed skull X-ray which was normal and was interested in [facility] note/PSA.  Last I heard, these had been requested.  I am happy to review them. MM  ----- Message -----    From: Heywood Footman, RN    Sent: 12/26/2015   4:34 PM      To: Tyler Pita, MD, # Subject: Radiation ???                                  Dr. Tammi Klippel.   Deigo Kinsman came in last week certain his cancer was back and only you could save him (I created a long EPIC note). I have obtained the office notes from his PCP and the notes from Dr. Karsten Ro. I can't find any indication for radiation therapy. Should I give these notes to you or Bryson Ha to review to determine if the same conclusion is found before I call this guy back?  Sam

## 2015-12-28 NOTE — Telephone Encounter (Signed)
Phoned patient per Dr. Johny Shears order. Explained that Dr. Tammi Klippel reviewed his skull x ray and it proved to be normal. Also, explained his PSA is low per notes obtained from [facility]. Patient verbalizes understanding. Patient does wish to set up follow up with Dr. Tammi Klippel. Patient understands Romie Jumper will call him with this follow up appointment on his [phone] phone.

## 2015-12-28 NOTE — Telephone Encounter (Signed)
CALLED PATIENT TO INFORM OF FU WITH DR. MANNING ON 01-12-16 @ 4:30 PM, SPOKE WITH PATIENT AND HE IS AWARE OF THIS APPT.

## 2016-01-12 ENCOUNTER — Ambulatory Visit
Admission: RE | Admit: 2016-01-12 | Discharge: 2016-01-12 | Disposition: A | Discharge status: Home / Self Care | Payer: Medicare HMO | Source: Ambulatory Visit | Attending: Radiation Oncology | Admitting: Radiation Oncology

## 2016-01-17 ENCOUNTER — Telehealth: Payer: Self-pay | Admitting: *Deleted

## 2016-01-17 ENCOUNTER — Encounter: Payer: Self-pay | Admitting: Radiation Oncology

## 2016-01-17 ENCOUNTER — Ambulatory Visit
Admission: RE | Admit: 2016-01-17 | Discharge: 2016-01-17 | Disposition: A | Discharge status: Home / Self Care | Payer: Medicare HMO | Source: Ambulatory Visit | Attending: Radiation Oncology | Admitting: Radiation Oncology

## 2016-01-17 VITALS — BP 151/83 | HR 91 | Temp 97.5°F | Ht 67.0 in | Wt 203.3 lb

## 2016-01-17 DIAGNOSIS — C61 Malignant neoplasm of prostate: Secondary | ICD-10-CM

## 2016-01-17 DIAGNOSIS — H6122 Impacted cerumen, left ear: Secondary | ICD-10-CM
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
## 2016-01-17 NOTE — Telephone Encounter (Signed)
CALLED PATIENT TO INFORM THAT FU WITH DR. Constance Holster HAS BEEN MOVED TO 02-13-16- ARRIVAL TIME - 12:40 PM, SPOKE WITH PATIENT AND HE IS AWARE OF THIS APPT. CHANGE

## 2016-01-17 NOTE — Progress Notes (Signed)
Mr. Gordon Young reports that he is voiding without straining, with intermittent dribbling, and at times does not empty completely. Nocturia 2-4.  Denies any rectal irritation and at times he has loose stools.

## 2016-09-15 ENCOUNTER — Inpatient Hospital Stay (HOSPITAL_COMMUNITY)
Admission: EM | Admit: 2016-09-15 | Discharge: 2016-09-18 | DRG: 638 | Discharge status: Against Medical Advice | Payer: Medicare (Managed Care) | Attending: Internal Medicine | Admitting: Internal Medicine

## 2016-09-15 ENCOUNTER — Encounter (HOSPITAL_COMMUNITY): Payer: Self-pay

## 2016-09-15 ENCOUNTER — Inpatient Hospital Stay (HOSPITAL_COMMUNITY): Payer: Medicare (Managed Care)

## 2016-09-15 DIAGNOSIS — N183 Chronic kidney disease, stage 3 unspecified: Secondary | ICD-10-CM | POA: Diagnosis present

## 2016-09-15 DIAGNOSIS — Z794 Long term (current) use of insulin: Secondary | ICD-10-CM | POA: Diagnosis not present

## 2016-09-15 DIAGNOSIS — E875 Hyperkalemia: Secondary | ICD-10-CM | POA: Diagnosis present

## 2016-09-15 DIAGNOSIS — I10 Essential (primary) hypertension: Secondary | ICD-10-CM | POA: Diagnosis present

## 2016-09-15 DIAGNOSIS — E1310 Other specified diabetes mellitus with ketoacidosis without coma: Secondary | ICD-10-CM

## 2016-09-15 DIAGNOSIS — D509 Iron deficiency anemia, unspecified: Secondary | ICD-10-CM | POA: Diagnosis present

## 2016-09-15 DIAGNOSIS — E1121 Type 2 diabetes mellitus with diabetic nephropathy: Secondary | ICD-10-CM | POA: Diagnosis not present

## 2016-09-15 DIAGNOSIS — E785 Hyperlipidemia, unspecified: Secondary | ICD-10-CM | POA: Diagnosis not present

## 2016-09-15 DIAGNOSIS — K219 Gastro-esophageal reflux disease without esophagitis: Secondary | ICD-10-CM | POA: Diagnosis not present

## 2016-09-15 DIAGNOSIS — F22 Delusional disorders: Secondary | ICD-10-CM | POA: Diagnosis present

## 2016-09-15 DIAGNOSIS — E1110 Type 2 diabetes mellitus with ketoacidosis without coma: Secondary | ICD-10-CM | POA: Diagnosis not present

## 2016-09-15 DIAGNOSIS — H9193 Unspecified hearing loss, bilateral: Secondary | ICD-10-CM | POA: Diagnosis present

## 2016-09-15 DIAGNOSIS — K920 Hematemesis: Secondary | ICD-10-CM

## 2016-09-15 DIAGNOSIS — E869 Volume depletion, unspecified: Secondary | ICD-10-CM | POA: Diagnosis present

## 2016-09-15 DIAGNOSIS — D638 Anemia in other chronic diseases classified elsewhere: Secondary | ICD-10-CM | POA: Diagnosis present

## 2016-09-15 DIAGNOSIS — N179 Acute kidney failure, unspecified: Secondary | ICD-10-CM | POA: Diagnosis present

## 2016-09-15 DIAGNOSIS — G629 Polyneuropathy, unspecified: Secondary | ICD-10-CM | POA: Diagnosis present

## 2016-09-15 DIAGNOSIS — Z91199 Patient's noncompliance with other medical treatment and regimen due to unspecified reason: Secondary | ICD-10-CM

## 2016-09-15 DIAGNOSIS — IMO0001 Reserved for inherently not codable concepts without codable children: Secondary | ICD-10-CM

## 2016-09-15 DIAGNOSIS — E876 Hypokalemia: Secondary | ICD-10-CM | POA: Diagnosis not present

## 2016-09-15 DIAGNOSIS — Z8546 Personal history of malignant neoplasm of prostate: Secondary | ICD-10-CM

## 2016-09-15 DIAGNOSIS — E119 Type 2 diabetes mellitus without complications: Secondary | ICD-10-CM | POA: Diagnosis not present

## 2016-09-15 DIAGNOSIS — Z23 Encounter for immunization: Secondary | ICD-10-CM | POA: Diagnosis present

## 2016-09-15 DIAGNOSIS — R4182 Altered mental status, unspecified: Secondary | ICD-10-CM | POA: Diagnosis not present

## 2016-09-15 DIAGNOSIS — Z79899 Other long term (current) drug therapy: Secondary | ICD-10-CM | POA: Diagnosis not present

## 2016-09-15 DIAGNOSIS — Z9114 Patient's other noncompliance with medication regimen: Secondary | ICD-10-CM

## 2016-09-15 DIAGNOSIS — R1110 Vomiting, unspecified: Secondary | ICD-10-CM | POA: Diagnosis not present

## 2016-09-15 DIAGNOSIS — E1101 Type 2 diabetes mellitus with hyperosmolarity with coma: Secondary | ICD-10-CM | POA: Diagnosis not present

## 2016-09-15 DIAGNOSIS — Z531 Procedure and treatment not carried out because of patient's decision for reasons of belief and group pressure: Secondary | ICD-10-CM | POA: Diagnosis not present

## 2016-09-15 DIAGNOSIS — Z87820 Personal history of traumatic brain injury: Secondary | ICD-10-CM | POA: Diagnosis not present

## 2016-09-15 DIAGNOSIS — I129 Hypertensive chronic kidney disease with stage 1 through stage 4 chronic kidney disease, or unspecified chronic kidney disease: Secondary | ICD-10-CM | POA: Diagnosis present

## 2016-09-15 DIAGNOSIS — E1122 Type 2 diabetes mellitus with diabetic chronic kidney disease: Secondary | ICD-10-CM | POA: Diagnosis not present

## 2016-09-15 DIAGNOSIS — Z9119 Patient's noncompliance with other medical treatment and regimen: Secondary | ICD-10-CM | POA: Diagnosis not present

## 2016-09-15 LAB — BASIC METABOLIC PANEL
Anion gap: 14 (ref 5–15)
Anion gap: 13 (ref 5–15)
Anion gap: 10 (ref 5–15)
Anion gap: 10 (ref 5–15)
Anion gap: 8 (ref 5–15)
BUN: 89 mg/dL — ABNORMAL HIGH (ref 6–20)
BUN: 84 mg/dL — ABNORMAL HIGH (ref 6–20)
BUN: 81 mg/dL — ABNORMAL HIGH (ref 6–20)
BUN: 79 mg/dL — ABNORMAL HIGH (ref 6–20)
BUN: 73 mg/dL — AB (ref 6–20)
CALCIUM: 8.7 mg/dL — AB (ref 8.9–10.3)
CHLORIDE: 110 mmol/L (ref 101–111)
CHLORIDE: 112 mmol/L — AB (ref 101–111)
CHLORIDE: 121 mmol/L — AB (ref 101–111)
CHLORIDE: 116 mmol/L — AB (ref 101–111)
CHLORIDE: 117 mmol/L — AB (ref 101–111)
CO2: 13 mmol/L — ABNORMAL LOW (ref 22–32)
CO2: 14 mmol/L — ABNORMAL LOW (ref 22–32)
CO2: 17 mmol/L — ABNORMAL LOW (ref 22–32)
CO2: 16 mmol/L — ABNORMAL LOW (ref 22–32)
CO2: 17 mmol/L — ABNORMAL LOW (ref 22–32)
CREATININE: 3.34 mg/dL — AB (ref 0.61–1.24)
CREATININE: 2.98 mg/dL — AB (ref 0.61–1.24)
CREATININE: 2.8 mg/dL — AB (ref 0.61–1.24)
CREATININE: 2.49 mg/dL — AB (ref 0.61–1.24)
Calcium: 9.4 mg/dL (ref 8.9–10.3)
Calcium: 8.9 mg/dL (ref 8.9–10.3)
Calcium: 9 mg/dL (ref 8.9–10.3)
Calcium: 8.8 mg/dL — ABNORMAL LOW (ref 8.9–10.3)
Creatinine, Ser: 3.31 mg/dL — ABNORMAL HIGH (ref 0.61–1.24)
GFR calc Af Amer: 21 mL/min — ABNORMAL LOW (ref >60)
GFR calc non Af Amer: 18 mL/min — ABNORMAL LOW (ref >60)
GFR, EST AFRICAN AMERICAN: 21 mL/min — AB (ref >60)
GFR, EST AFRICAN AMERICAN: 24 mL/min — AB (ref >60)
GFR, EST AFRICAN AMERICAN: 26 mL/min — AB (ref >60)
GFR, EST AFRICAN AMERICAN: 30 mL/min — AB (ref >60)
GFR, EST NON AFRICAN AMERICAN: 18 mL/min — AB (ref >60)
GFR, EST NON AFRICAN AMERICAN: 21 mL/min — AB (ref >60)
GFR, EST NON AFRICAN AMERICAN: 22 mL/min — AB (ref >60)
GFR, EST NON AFRICAN AMERICAN: 26 mL/min — AB (ref >60)
Glucose, Bld: 589 mg/dL (ref 65–99)
Glucose, Bld: 544 mg/dL (ref 65–99)
Glucose, Bld: 152 mg/dL — ABNORMAL HIGH (ref 65–99)
Glucose, Bld: 259 mg/dL — ABNORMAL HIGH (ref 65–99)
Glucose, Bld: 127 mg/dL — ABNORMAL HIGH (ref 65–99)
POTASSIUM: 6.2 mmol/L — AB (ref 3.5–5.1)
POTASSIUM: 4.9 mmol/L (ref 3.5–5.1)
POTASSIUM: 4.4 mmol/L (ref 3.5–5.1)
Potassium: 4.5 mmol/L (ref 3.5–5.1)
Potassium: 3.9 mmol/L (ref 3.5–5.1)
SODIUM: 137 mmol/L (ref 135–145)
SODIUM: 139 mmol/L (ref 135–145)
SODIUM: 148 mmol/L — AB (ref 135–145)
SODIUM: 142 mmol/L (ref 135–145)
Sodium: 142 mmol/L (ref 135–145)

## 2016-09-15 LAB — CBC
HEMATOCRIT: 30.3 % — AB (ref 39.0–52.0)
HEMATOCRIT: 26.2 % — AB (ref 39.0–52.0)
HEMOGLOBIN: 9.7 g/dL — AB (ref 13.0–17.0)
Hemoglobin: 8.4 g/dL — ABNORMAL LOW (ref 13.0–17.0)
MCH: 18.8 pg — ABNORMAL LOW (ref 26.0–34.0)
MCH: 18.9 pg — AB (ref 26.0–34.0)
MCHC: 32 g/dL (ref 30.0–36.0)
MCHC: 32.1 g/dL (ref 30.0–36.0)
MCV: 58.7 fL — AB (ref 78.0–100.0)
MCV: 59 fL — AB (ref 78.0–100.0)
PLATELETS: 146 10*3/uL — AB (ref 150–400)
Platelets: 163 10*3/uL (ref 150–400)
RBC: 5.16 MIL/uL (ref 4.22–5.81)
RBC: 4.44 MIL/uL (ref 4.22–5.81)
RDW: 17.5 % — AB (ref 11.5–15.5)
RDW: 17.5 % — AB (ref 11.5–15.5)
WBC: 10.6 10*3/uL — ABNORMAL HIGH (ref 4.0–10.5)
WBC: 10.8 10*3/uL — AB (ref 4.0–10.5)

## 2016-09-15 LAB — I-STAT TROPONIN, ED: Troponin i, poc: 0.02 ng/mL (ref 0.00–0.08)

## 2016-09-15 LAB — CBG MONITORING, ED
GLUCOSE-CAPILLARY: 596 mg/dL — AB (ref 65–99)
GLUCOSE-CAPILLARY: 534 mg/dL — AB (ref 65–99)
GLUCOSE-CAPILLARY: 329 mg/dL — AB (ref 65–99)
GLUCOSE-CAPILLARY: 237 mg/dL — AB (ref 65–99)
Glucose-Capillary: 511 mg/dL (ref 65–99)
Glucose-Capillary: 508 mg/dL (ref 65–99)
Glucose-Capillary: 460 mg/dL — ABNORMAL HIGH (ref 65–99)
Glucose-Capillary: 454 mg/dL — ABNORMAL HIGH (ref 65–99)
Glucose-Capillary: 417 mg/dL — ABNORMAL HIGH (ref 65–99)
Glucose-Capillary: 149 mg/dL — ABNORMAL HIGH (ref 65–99)
Glucose-Capillary: 115 mg/dL — ABNORMAL HIGH (ref 65–99)
Glucose-Capillary: 130 mg/dL — ABNORMAL HIGH (ref 65–99)

## 2016-09-15 LAB — RAPID URINE DRUG SCREEN, HOSP PERFORMED
Amphetamines: NOT DETECTED
Barbiturates: NOT DETECTED
Benzodiazepines: NOT DETECTED
Cocaine: NOT DETECTED
OPIATES: NOT DETECTED
Tetrahydrocannabinol: NOT DETECTED

## 2016-09-15 LAB — RETICULOCYTES
RBC.: 4.61 MIL/uL (ref 4.22–5.81)
RETIC COUNT ABSOLUTE: 59.9 10*3/uL (ref 19.0–186.0)
RETIC CT PCT: 1.3 % (ref 0.4–3.1)

## 2016-09-15 LAB — URINALYSIS, ROUTINE W REFLEX MICROSCOPIC
BACTERIA UA: NONE SEEN
Bilirubin Urine: NEGATIVE
KETONES UR: NEGATIVE mg/dL
Leukocytes, UA: NEGATIVE
NITRITE: NEGATIVE
PROTEIN: 30 mg/dL — AB
Specific Gravity, Urine: 1.016 (ref 1.005–1.030)
pH: 5 (ref 5.0–8.0)

## 2016-09-15 LAB — GLUCOSE, CAPILLARY
GLUCOSE-CAPILLARY: 107 mg/dL — AB (ref 65–99)
GLUCOSE-CAPILLARY: 122 mg/dL — AB (ref 65–99)
GLUCOSE-CAPILLARY: 160 mg/dL — AB (ref 65–99)
GLUCOSE-CAPILLARY: 166 mg/dL — AB (ref 65–99)
GLUCOSE-CAPILLARY: 227 mg/dL — AB (ref 65–99)
GLUCOSE-CAPILLARY: 89 mg/dL (ref 65–99)
Glucose-Capillary: 68 mg/dL (ref 65–99)

## 2016-09-15 LAB — MRSA PCR SCREENING: MRSA by PCR: NEGATIVE

## 2016-09-15 LAB — IRON AND TIBC
Iron: 23 ug/dL — ABNORMAL LOW (ref 45–182)
Saturation Ratios: 10 % — ABNORMAL LOW (ref 17.9–39.5)
TIBC: 227 ug/dL — ABNORMAL LOW (ref 250–450)
UIBC: 204 ug/dL

## 2016-09-15 LAB — TROPONIN I

## 2016-09-15 LAB — VITAMIN B12: Vitamin B-12: 1485 pg/mL — ABNORMAL HIGH (ref 180–914)

## 2016-09-15 LAB — BETA-HYDROXYBUTYRIC ACID
BETA-HYDROXYBUTYRIC ACID: 2.34 mmol/L — AB (ref 0.05–0.27)
Beta-Hydroxybutyric Acid: 2.63 mmol/L — ABNORMAL HIGH (ref 0.05–0.27)

## 2016-09-15 LAB — NA AND K (SODIUM & POTASSIUM), RAND UR
POTASSIUM UR: 35 mmol/L
SODIUM UR: 29 mmol/L

## 2016-09-15 LAB — FERRITIN: FERRITIN: 157 ng/mL (ref 24–336)

## 2016-09-15 LAB — POTASSIUM: POTASSIUM: 7.2 mmol/L — AB (ref 3.5–5.1)

## 2016-09-15 LAB — FOLATE: Folate: 26.1 ng/mL (ref >5.9)

## 2016-09-15 LAB — BRAIN NATRIURETIC PEPTIDE: B NATRIURETIC PEPTIDE 5: 59.1 pg/mL (ref 0.0–100.0)

## 2016-09-15 MED ORDER — SODIUM CHLORIDE 0.45 % IV SOLN
INTRAVENOUS | Status: DC
  Administered 2016-09-15 – 2016-09-18 (×6): via INTRAVENOUS

## 2016-09-15 MED ORDER — SODIUM CHLORIDE 0.9 % IV SOLN
INTRAVENOUS | Status: DC
  Administered 2016-09-15: 4.5 [IU]/h via INTRAVENOUS
  Administered 2016-09-15: 17.9 [IU]/h via INTRAVENOUS
  Filled 2016-09-15: qty 2.5

## 2016-09-15 MED ORDER — CALCIUM GLUCONATE 10 % IV SOLN
1.0000 g | Freq: Once | INTRAVENOUS | Status: AC
  Administered 2016-09-15: 1 g via INTRAVENOUS
  Filled 2016-09-15: qty 10

## 2016-09-15 MED ORDER — SODIUM CHLORIDE 0.9 % IV SOLN
INTRAVENOUS | Status: DC
  Administered 2016-09-15 (×2): via INTRAVENOUS

## 2016-09-15 MED ORDER — DEXTROSE 50 % IV SOLN
INTRAVENOUS | Status: AC
  Administered 2016-09-15: 50 mL
  Filled 2016-09-15: qty 50

## 2016-09-15 MED ORDER — SODIUM CHLORIDE 0.9 % IV BOLUS (SEPSIS)
1000.0000 mL | Freq: Once | INTRAVENOUS | Status: AC
  Administered 2016-09-15: 1000 mL via INTRAVENOUS

## 2016-09-15 MED ORDER — INSULIN ASPART 100 UNIT/ML ~~LOC~~ SOLN
0.0000 [IU] | Freq: Three times a day (TID) | SUBCUTANEOUS | Status: DC
  Administered 2016-09-15: 2 [IU] via SUBCUTANEOUS
  Administered 2016-09-16: 3 [IU] via SUBCUTANEOUS
  Administered 2016-09-16: 2 [IU] via SUBCUTANEOUS
  Administered 2016-09-16: 8 [IU] via SUBCUTANEOUS
  Administered 2016-09-17: 11 [IU] via SUBCUTANEOUS
  Administered 2016-09-17: 5 [IU] via SUBCUTANEOUS
  Administered 2016-09-18: 3 [IU] via SUBCUTANEOUS
  Administered 2016-09-18: 5 [IU] via SUBCUTANEOUS
  Filled 2016-09-15: qty 1

## 2016-09-15 MED ORDER — SODIUM CHLORIDE 0.9 % IV SOLN: INTRAVENOUS | Status: DC

## 2016-09-15 MED ORDER — SODIUM BICARBONATE 8.4 % IV SOLN
50.0000 meq | Freq: Once | INTRAVENOUS | Status: AC
  Administered 2016-09-15: 50 meq via INTRAVENOUS
  Filled 2016-09-15: qty 50

## 2016-09-15 MED ORDER — INSULIN ASPART 100 UNIT/ML ~~LOC~~ SOLN
SUBCUTANEOUS | Status: AC
  Filled 2016-09-15: qty 1

## 2016-09-15 MED ORDER — ALBUTEROL SULFATE (2.5 MG/3ML) 0.083% IN NEBU
10.0000 mg | INHALATION_SOLUTION | Freq: Once | RESPIRATORY_TRACT | Status: AC
  Administered 2016-09-15: 10 mg via RESPIRATORY_TRACT
  Filled 2016-09-15: qty 12

## 2016-09-15 MED ORDER — PANTOPRAZOLE SODIUM 40 MG IV SOLR
40.0000 mg | Freq: Two times a day (BID) | INTRAVENOUS | Status: DC
  Administered 2016-09-15 – 2016-09-18 (×7): 40 mg via INTRAVENOUS
  Filled 2016-09-15 (×7): qty 40

## 2016-09-15 MED ORDER — DEXTROSE-NACL 5-0.45 % IV SOLN
INTRAVENOUS | Status: DC
  Administered 2016-09-15: 13:00:00 via INTRAVENOUS

## 2016-09-15 MED ORDER — PNEUMOCOCCAL VAC POLYVALENT 25 MCG/0.5ML IJ INJ
0.5000 mL | INJECTION | INTRAMUSCULAR | Status: AC
  Administered 2016-09-16: 0.5 mL via INTRAMUSCULAR
  Filled 2016-09-15: qty 0.5

## 2016-09-15 MED ORDER — ALBUTEROL SULFATE (2.5 MG/3ML) 0.083% IN NEBU
INHALATION_SOLUTION | RESPIRATORY_TRACT | Status: AC
  Filled 2016-09-15: qty 3

## 2016-09-15 MED ORDER — INFLUENZA VAC SPLIT QUAD 0.5 ML IM SUSY
0.5000 mL | PREFILLED_SYRINGE | INTRAMUSCULAR | Status: AC
  Administered 2016-09-16: 0.5 mL via INTRAMUSCULAR
  Filled 2016-09-15: qty 0.5

## 2016-09-15 MED ORDER — DEXTROSE-NACL 5-0.45 % IV SOLN: INTRAVENOUS | Status: DC

## 2016-09-15 MED ORDER — INSULIN GLARGINE 100 UNIT/ML ~~LOC~~ SOLN
5.0000 [IU] | Freq: Once | SUBCUTANEOUS | Status: AC
  Administered 2016-09-15: 5 [IU] via SUBCUTANEOUS
  Filled 2016-09-15: qty 0.05

## 2016-09-15 MED ORDER — HEPARIN SODIUM (PORCINE) 5000 UNIT/ML IJ SOLN
5000.0000 [IU] | Freq: Three times a day (TID) | INTRAMUSCULAR | Status: DC
  Administered 2016-09-15: 5000 [IU] via SUBCUTANEOUS
  Filled 2016-09-15: qty 1

## 2016-09-15 MED ORDER — INSULIN ASPART 100 UNIT/ML IV SOLN
10.0000 [IU] | Freq: Once | INTRAVENOUS | Status: AC
  Administered 2016-09-15: 10 [IU] via INTRAVENOUS

## 2016-09-15 MED ORDER — SODIUM POLYSTYRENE SULFONATE 15 GM/60ML PO SUSP
50.0000 g | Freq: Once | ORAL | Status: AC
  Administered 2016-09-15: 50 g via ORAL
  Filled 2016-09-15: qty 240
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
## 2016-09-15 NOTE — ED Notes (Signed)
MD Charlies Silvers notified of pt. Closed anion gap and current CBG. MD to put in orders for glycemic control to come off of glucose stabilizer.
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
## 2016-09-15 NOTE — Progress Notes (Signed)
Hypoglycemic Event  CBG: 69  Treatment: Dextrose 50% 53ml  Symptoms: tremor/shaky  Follow-up CBG: Time: 2131 CBG Result:107  Possible Reasons for Event: glucostabilizer  Comments/MD notified: Dr. Hal Hope    Gordon Young, [name]

## 2016-09-15 NOTE — ED Notes (Signed)
Checked CBG 596, RN Jessica informed

## 2016-09-15 NOTE — ED Notes (Signed)
2 unsuccessful attempts by two different staff members to insert NG tube using size 17F and 48F.

## 2016-09-15 NOTE — Progress Notes (Addendum)
Patient ID: Gordon Young, male   DOB: [birth year], 66 y.o.   MRN: [id number]  PROGRESS NOTE    Gordon Young  [id number] DOB: 1951/08/31 DOA: 09/15/2016  PCP: Minerva Ends, MD   Brief Narrative:  66 y.o. male with medical history significant for insulin-dependent diabetes mellitus, hypertension, chronic kidney disease stage III with baseline creatinine 1.48 in June 2016. Patient presented to ED with lethargy and vomiting. Patient is noncompliant with his medications including insulin. He was brought to ED by his son. Of note, patient's family on admission reported that the patient used to live with his son and daughter-in-law but stopped living with them in November because he was concerned that "they were going through his stuff". Patient apparently had multiple pad locks on his closet but still had thought that his son and daughter-in-law who are taking his tonsils. He has been also spending money on sweepstakes and apparently brought to new iPhone's today prior to this admission even though he is unable to afford this. Patient is Jehovah's Witness.  On admission, blood pressure was 96/69 but with IV fluids it has improved to 149/63. Patient was afebrile, heart rate was 132. Oxygen saturation was 98% on room air. Blood work was notable for white blood cell count of 10.6, hemoglobin 9.7. The 12-lead EKG showed sinus rhythm with peaked T waves. Potassium was 6.8 and then 7.2. He was given calcium gluconate, Kayexalate, bicarbonate, albuterol and he is already on insulin drip.  Assessment & Plan:   Principal Problem: Hyperosmolar hyperglycemic nonketotic coma / uncontrolled diabetes mellitus with diabetic nephropathy with long-term insulin use - Patient with glucose of 656 on BMP, glucose was more than 1000 on urinalysis but there were no ketones. AG was 12 and CO2 17 - Patient was started on insulin drip - Continue CBG monitoring every hour and BMP monitoring every 4 hours - Continue  supportive care with IV fluids - Monitor in step down unit while patient on insulin drip  Active Problems: Coffee-ground emesis - Patient so far has had 2 episodes of coffee-ground emesis - Place NG tube - Check CBC - Appreciate GI consult and recommendations  - Start Protonix 40 mg IV every 12 hours  Acute renal failure superimposed on chronic kidney disease stage III - Baseline creatinine in June 2016 was 1.48 and creatinine on this admission 3.33 - Likely in the setting of hyperglycemia, GI losses - Continue IV fluids  Hyperkalemia - Likely due to hyperglycemic hyperosmolar nonketotic coma, acute renal failure superimposed on chronic kidney disease - As noted above, treated hyperkalemia with calcium gluconate, insulin sodium bicarbonate, albuterol and Kayexalate - Repeat potassium level   DVT prophylaxis: Stop heparin subcutaneous because of coffee ground emesis and use SCDs for DVT prophylaxis Code Status: full code  Family Communication: No family at the bedside Disposition Plan: Admission to stepdown unit   Consultants:   GI  Procedures:   NG to be placed 1/6  Antimicrobials:   None    Subjective: Coffee ground emesis this am.   Objective: Vitals:   09/15/16 0730 09/15/16 0734 09/15/16 0800 09/15/16 0830  BP: (!) 114/53 (!) 114/53 (!) 116/46 (!) 127/53  Pulse: 118 120 (!) 124 (!) 123  Resp: 11 17 14 18   Temp:      TempSrc:      SpO2: 100% 100% 99% 100%  Weight:      Height:       No intake or output data in the 24 hours ending  09/15/16 [id number] Filed Weights   09/15/16 0149  Weight: 77.1 kg (170 lb)    Examination:  General exam: lethargic, able to follow some simple commands  Respiratory system: Clear to auscultation. Respiratory effort normal. Cardiovascular system: S1 & S2 heard, RRR. No JVD, murmurs, rubs, gallops or clicks. No pedal edema. Gastrointestinal system: Abdomen is nondistended, soft and nontender. No organomegaly or masses felt.  Normal bowel sounds heard. Central nervous system: Lethargic but able to say he is in hospital, him name Extremities: Symmetric 5 x 5 power. Skin: No rashes, lesions or ulcers Psychiatry: Normal mood and behavior   Data Reviewed: I have personally reviewed following labs and imaging studies  CBC:  Recent Labs Lab 09/15/16 0154  WBC 10.6*  HGB 9.7*  HCT 30.3*  MCV 58.7*  PLT [id number]   Basic Metabolic Panel:  Recent Labs Lab 09/15/16 0154 09/15/16 0440 09/15/16 0546  NA 135  --  137  K 6.8* 7.2* 6.2*  CL 106  --  110  CO2 17*  --  13*  GLUCOSE 656*  --  589*  BUN 83*  --  89*  CREATININE 3.36*  --  3.31*  CALCIUM 9.7  --  9.4   GFR: Estimated Creatinine Clearance: 22.8 mL/min (by C-G formula based on SCr of 3.31 mg/dL (H)). Liver Function Tests: No results for input(s): AST, ALT, ALKPHOS, BILITOT, PROT, ALBUMIN in the last 168 hours. No results for input(s): LIPASE, AMYLASE in the last 168 hours. No results for input(s): AMMONIA in the last 168 hours. Coagulation Profile: No results for input(s): INR, PROTIME in the last 168 hours. Cardiac Enzymes: No results for input(s): CKTOTAL, CKMB, CKMBINDEX, TROPONINI in the last 168 hours. BNP (last 3 results) No results for input(s): PROBNP in the last 8760 hours. HbA1C: No results for input(s): HGBA1C in the last 72 hours. CBG:  Recent Labs Lab 09/15/16 0446 09/15/16 0604 09/15/16 0709 09/15/16 0824 09/15/16 0929  GLUCAP 534* 511* 508* 460* 454*   Lipid Profile: No results for input(s): CHOL, HDL, LDLCALC, TRIG, CHOLHDL, LDLDIRECT in the last 72 hours. Thyroid Function Tests: No results for input(s): TSH, T4TOTAL, FREET4, T3FREE, THYROIDAB in the last 72 hours. Anemia Panel:  Recent Labs  09/15/16 0609  VITAMINB12 1,485*  FOLATE 26.1  FERRITIN 157  TIBC 227*  IRON 23*  RETICCTPCT 1.3   Urine analysis:    Component Value Date/Time   COLORURINE YELLOW 12/19/2014 0011   APPEARANCEUR CLOUDY (A)  12/19/2014 0011   LABSPEC 1.027 12/19/2014 0011   PHURINE 6.5 12/19/2014 0011   GLUCOSEU >1000 (A) 12/19/2014 0011   HGBUR LARGE (A) 12/19/2014 0011   HGBUR negative 02/06/2008 1352   BILIRUBINUR neg 04/07/2015 1425   KETONESUR NEGATIVE 12/19/2014 0011   PROTEINUR 100 04/07/2015 1425   PROTEINUR 100 (A) 12/19/2014 0011   UROBILINOGEN 0.2 04/07/2015 1425   UROBILINOGEN 1.0 12/19/2014 0011   NITRITE neg 04/07/2015 1425   NITRITE NEGATIVE 12/19/2014 0011   LEUKOCYTESUR Negative 04/07/2015 1425   Sepsis Labs: @LABRCNTIP (procalcitonin:4,lacticidven:4)   )No results found for this or any previous visit (from the past 240 hour(s)).    Radiology Studies: Dg Chest 1 View Result Date: 09/15/2016 No active disease. Electronically Signed   By: Inez Catalina M.D.   On: 09/15/2016 07:11   US Renal Result Date: 09/15/2016  Cholelithiasis. No acute abnormality is noted in the kidneys.     Scheduled Meds: . pantoprazole (PROTONIX) IV  40 mg Intravenous Q12H   Continuous Infusions: .  sodium chloride 125 mL/hr at 09/15/16 0555  . dextrose 5 % and 0.45% NaCl    . insulin (NOVOLIN-R) infusion 15.8 Units/hr (09/15/16 0934)     LOS: 0 days    Time spent: 25 minutes  Greater than 50% of the time spent on counseling and coordinating the care.   Leisa Lenz, MD [facility] Pager [phone]  If 7PM-7AM, please contact night-coverage [email or web address] Password [facility] 09/15/2016, 9:37 AM

## 2016-09-15 NOTE — ED Triage Notes (Signed)
Vomited x 2

## 2016-09-15 NOTE — ED Notes (Signed)
  CBG 417

## 2016-09-15 NOTE — ED Notes (Signed)
Per previous shift unable to obtain NG tube, admitting made aware by previous shift. Awaiting further orders.

## 2016-09-15 NOTE — ED Notes (Signed)
Admitting paged about CBG 149 to ask about continuing glucose stabilizer. Will continue to monitor.

## 2016-09-15 NOTE — ED Notes (Addendum)
Alarms for EKG showing Pair PVCs, and trigeminy. Admitting paged and strip printed. Pt alert, denies pain, NAD. Will continue to monitor.

## 2016-09-15 NOTE — ED Notes (Signed)
Lab at bedside

## 2016-09-15 NOTE — ED Notes (Signed)
This RN attempted blood draw for BNP and trop and unable to obtain lab notified.

## 2016-09-15 NOTE — ED Triage Notes (Signed)
Pt comes from home, brought in by family, according to family pt has been more lethargic today while visiting and very tired, has not checked his CBG or took his insulin. Pt arousable to voice and states that he is just sick, CBG 596 at triage. Pt has a hx of DKA

## 2016-09-15 NOTE — ED Triage Notes (Signed)
Family is also requesting psych evaluating for pt stating that he has been having hallucinations of rats in his mattresses. Not buying food for his house ect.

## 2016-09-16 DIAGNOSIS — N183 Chronic kidney disease, stage 3 (moderate): Secondary | ICD-10-CM

## 2016-09-16 DIAGNOSIS — E875 Hyperkalemia: Secondary | ICD-10-CM

## 2016-09-16 DIAGNOSIS — N179 Acute kidney failure, unspecified: Secondary | ICD-10-CM

## 2016-09-16 DIAGNOSIS — E1101 Type 2 diabetes mellitus with hyperosmolarity with coma: Secondary | ICD-10-CM

## 2016-09-16 LAB — GLUCOSE, CAPILLARY
GLUCOSE-CAPILLARY: 175 mg/dL — AB (ref 65–99)
GLUCOSE-CAPILLARY: 130 mg/dL — AB (ref 65–99)
Glucose-Capillary: 141 mg/dL — ABNORMAL HIGH (ref 65–99)
Glucose-Capillary: 200 mg/dL — ABNORMAL HIGH (ref 65–99)
Glucose-Capillary: 306 mg/dL — ABNORMAL HIGH (ref 65–99)
Glucose-Capillary: 79 mg/dL (ref 65–99)

## 2016-09-16 LAB — BASIC METABOLIC PANEL
ANION GAP: 12 (ref 5–15)
BUN: 83 mg/dL — AB (ref 6–20)
CO2: 17 mmol/L — AB (ref 22–32)
Calcium: 9.7 mg/dL (ref 8.9–10.3)
Chloride: 106 mmol/L (ref 101–111)
Creatinine, Ser: 3.36 mg/dL — ABNORMAL HIGH (ref 0.61–1.24)
GFR calc Af Amer: 21 mL/min — ABNORMAL LOW (ref >60)
GFR calc non Af Amer: 18 mL/min — ABNORMAL LOW (ref >60)
GLUCOSE: 656 mg/dL — AB (ref 65–99)
POTASSIUM: 6.8 mmol/L — AB (ref 3.5–5.1)
Sodium: 135 mmol/L (ref 135–145)

## 2016-09-16 LAB — HEMOGLOBIN A1C
Hgb A1c MFr Bld: 13.5 % — ABNORMAL HIGH (ref 4.8–5.6)
MEAN PLASMA GLUCOSE: 341 mg/dL

## 2016-09-16 MED ORDER — INSULIN ASPART 100 UNIT/ML ~~LOC~~ SOLN
3.0000 [IU] | Freq: Three times a day (TID) | SUBCUTANEOUS | Status: DC
  Administered 2016-09-16 – 2016-09-18 (×5): 3 [IU] via SUBCUTANEOUS

## 2016-09-16 NOTE — Progress Notes (Signed)
Patient ID: Gordon Young, male   DOB: 03/20/1951, 66 y.o.   MRN: [id number]  PROGRESS NOTE    EVERN ENWRIGHT  [id number] DOB: 04/04/1951 DOA: 09/15/2016  PCP: Minerva Ends, MD   Brief Narrative:  66 y.o. male with medical history significant for insulin-dependent diabetes mellitus, hypertension, chronic kidney disease stage III with baseline creatinine 1.48 in June 2016. Patient presented to ED with lethargy and vomiting. Patient is noncompliant with his medications including insulin. He was brought to ED by his son. Of note, patient's family on admission reported that the patient used to live with his son and daughter-in-law but stopped living with them in November because he was concerned that "they were going through his stuff". Patient apparently had multiple pad locks on his closet but still had thought that his son and daughter-in-law who are taking his tonsils. He has been also spending money on sweepstakes and apparently brought to new iPhone's today prior to this admission even though he is unable to afford this. Patient is Jehovah's Witness.  On admission, blood pressure was 96/69 but with IV fluids it has improved to 149/63. Patient was afebrile, heart rate was 132. Oxygen saturation was 98% on room air. Blood work was notable for white blood cell count of 10.6, hemoglobin 9.7. The 12-lead EKG showed sinus rhythm with peaked T waves. Potassium was 6.8 and then 7.2. He was given calcium gluconate, Kayexalate, bicarbonate, albuterol and he was started on insulin drip.  He had coffee ground emesis and GI has seen him in consultation.  Assessment & Plan:   Principal Problem: Hyperosmolar hyperglycemic nonketotic coma / uncontrolled diabetes mellitus with diabetic nephropathy with long-term insulin use / High AG metabolic acidosis  - Glucose of 656 on admission, glucose was more than 1000 on urinalysis but there were no ketones. AG was 12 and CO2 17 - Patient was started on insulin  drip but in the middle of the afternoon transitioned back to subQ insulin - CBG's in past 24 hours: 200, 175, 130 - Add novolog 3 units TIDAC - Transfer to telemetry floor   Active Problems: Coffee-ground emesis - Has resolved since - He did not have NG tube - Now that he is better GI advanced diet and no need for endoscopic intervention - Continue protonix 40 mg IV Q 12 hours  - Check CBC in am  Acute renal failure superimposed on chronic kidney disease stage III - Baseline creatinine in June 2016 was 1.48 and creatinine on this admission 3.33 - Cr improving with IV fluids  - Cr 2.4 this am  Hyperkalemia - Likely due to hyperglycemic hyperosmolar nonketotic coma, acute renal failure superimposed on chronic kidney disease - As noted above, treated hyperkalemia with calcium gluconate, insulin sodium bicarbonate, albuterol and Kayexalate - Repeat potassium level WNL  Hypernatremia - From hyperglycemia - Improved as CBG's improved    DVT prophylaxis: SCD's bilaterally  Code Status: full code  Family Communication: No family at the bedside Disposition Plan: transfer to telemetry    Consultants:   GI  Procedures:   NG to be placed 1/6  Antimicrobials:   None    Subjective: No overnight events.   Objective: Vitals:   09/16/16 0440 09/16/16 0503 09/16/16 0724 09/16/16 1145  BP: (!) 170/94 (!) 170/94 (!) 172/95 129/76  Pulse:   (!) 106   Resp: 11  18   Temp:  98.3 F (36.8 C) 98.8 F (37.1 C) 99.3 F (37.4 C)  TempSrc:  Oral  Axillary Axillary  SpO2: 99%  92%   Weight:      Height:        Intake/Output Summary (Last 24 hours) at 09/16/16 1439 Last data filed at 09/16/16 0900  Gross per 24 hour  Intake          2802.93 ml  Output              875 ml  Net          1927.93 ml   Filed Weights   09/15/16 0149 09/15/16 1748  Weight: 77.1 kg (170 lb) 82.9 kg (182 lb 12.2 oz)    Examination:  General exam: no distress  Respiratory system: No wheezing,  no rhonchi  Cardiovascular system: S1 & S2 heard, Rate controlled  Gastrointestinal system: (+) BS, non tender  Central nervous system: No focal deficits  Extremities: Symmetric 5 x 5 power. Skin: warm, dry  Psychiatry: Normal mood and behavior, no agitation   Data Reviewed: I have personally reviewed following labs and imaging studies  CBC:  Recent Labs Lab 09/15/16 0154 09/15/16 1013  WBC 10.6* 10.8*  HGB 9.7* 8.4*  HCT 30.3* 26.2*  MCV 58.7* 59.0*  PLT 163 [id number]*   Basic Metabolic Panel:  Recent Labs Lab 09/15/16 0546 09/15/16 0911 09/15/16 1440 09/15/16 1802 09/15/16 2114  NA 137 139 148* 142 142  K 6.2* 4.9 4.4 4.5 3.9  CL 110 112* 121* 116* 117*  CO2 13* 14* 17* 16* 17*  GLUCOSE 589* 544* 152* 259* 127*  BUN 89* 84* 81* 79* 73*  CREATININE 3.31* 3.34* 2.98* 2.80* 2.49*  CALCIUM 9.4 8.9 9.0 8.8* 8.7*   GFR: Estimated Creatinine Clearance: 31.3 mL/min (by C-G formula based on SCr of 2.49 mg/dL (H)). Liver Function Tests: No results for input(s): AST, ALT, ALKPHOS, BILITOT, PROT, ALBUMIN in the last 168 hours. No results for input(s): LIPASE, AMYLASE in the last 168 hours. No results for input(s): AMMONIA in the last 168 hours. Coagulation Profile: No results for input(s): INR, PROTIME in the last 168 hours. Cardiac Enzymes:  Recent Labs Lab 09/15/16 0911  TROPONINI <0.03   BNP (last 3 results) No results for input(s): PROBNP in the last 8760 hours. HbA1C:  Recent Labs  09/15/16 1013  HGBA1C 13.5*   CBG:  Recent Labs Lab 09/15/16 2221 09/15/16 2344 09/16/16 0403 09/16/16 0728 09/16/16 1147  GLUCAP 89 122* 200* 175* 130*   Lipid Profile: No results for input(s): CHOL, HDL, LDLCALC, TRIG, CHOLHDL, LDLDIRECT in the last 72 hours. Thyroid Function Tests: No results for input(s): TSH, T4TOTAL, FREET4, T3FREE, THYROIDAB in the last 72 hours. Anemia Panel:  Recent Labs  09/15/16 0609  VITAMINB12 1,485*  FOLATE 26.1  FERRITIN 157  TIBC  227*  IRON 23*  RETICCTPCT 1.3   Urine analysis:    Component Value Date/Time   COLORURINE YELLOW 09/15/2016 1400   APPEARANCEUR HAZY (A) 09/15/2016 1400   LABSPEC 1.016 09/15/2016 1400   PHURINE 5.0 09/15/2016 1400   GLUCOSEU >=500 (A) 09/15/2016 1400   HGBUR SMALL (A) 09/15/2016 1400   HGBUR negative 02/06/2008 1352   BILIRUBINUR NEGATIVE 09/15/2016 1400   BILIRUBINUR neg 04/07/2015 1425   KETONESUR NEGATIVE 09/15/2016 1400   PROTEINUR 30 (A) 09/15/2016 1400   UROBILINOGEN 0.2 04/07/2015 1425   UROBILINOGEN 1.0 12/19/2014 0011   NITRITE NEGATIVE 09/15/2016 1400   LEUKOCYTESUR NEGATIVE 09/15/2016 1400   Sepsis Labs: @LABRCNTIP (procalcitonin:4,lacticidven:4)  Recent Results (from the past 240 hour(s))  MRSA PCR Screening  Status: None   Collection Time: 09/15/16  5:47 PM  Result Value Ref Range Status   MRSA by PCR NEGATIVE NEGATIVE Final      Radiology Studies: Dg Chest 1 View Result Date: 09/15/2016 No active disease. Electronically Signed   By: Inez Catalina M.D.   On: 09/15/2016 07:11   US Renal Result Date: 09/15/2016  Cholelithiasis. No acute abnormality is noted in the kidneys.     Scheduled Meds: . insulin aspart  0-15 Units Subcutaneous TID WC  . pantoprazole (PROTONIX) IV  40 mg Intravenous Q12H   Continuous Infusions: . sodium chloride 100 mL/hr at 09/16/16 0759     LOS: 1 day    Time spent: 25 minutes  Greater than 50% of the time spent on counseling and coordinating the care.   Leisa Lenz, MD [facility] Pager [phone]  If 7PM-7AM, please contact night-coverage [email or web address] Password [facility] 09/16/2016, 2:39 PM

## 2016-09-16 NOTE — Progress Notes (Signed)
Patient transferring to [place]. Report called to [facility] RN. All questions answered.

## 2016-09-16 NOTE — Progress Notes (Signed)
Patient arrived to unit at approximately 1700 via bed.  Patient stable and denies complaints. Telemetry placed per order and CCMD notified. IV infusing.

## 2016-09-16 NOTE — Progress Notes (Signed)
   Patient Name: Gordon Young Date of Encounter: 09/16/2016, 10:13 AM    Subjective  No more vomiting RN reports he is asking for food sleepinng now   Objective  BP (!) 172/95 (BP Location: Left Arm)   Pulse (!) 106   Temp 98.8 F (37.1 C) (Axillary)   Resp 18   Ht 5\' 7"  (1.702 m)   Wt 182 lb 12.2 oz (82.9 kg)   SpO2 92%   BMI 28.62 kg/m    CBC Latest Ref Rng & Units 09/15/2016 09/15/2016 04/07/2015  WBC 4.0 - 10.5 K/uL 10.8(H) 10.6(H) 7.7  Hemoglobin 13.0 - 17.0 g/dL 8.4(L) 9.7(L) 8.9(L)  Hematocrit 39.0 - 52.0 % 26.2(L) 30.3(L) 29.3(L)  Platelets 150 - 400 K/uL 146(L) 163 285     Assessment and Plan  Coffee ground emesis - resolved  Will start diet No need for endoscopic intervention Call back if needed - (I am covering for Dr. Benson Norway) Signing off  Gatha Mayer, MD, [facility] [phone] (pager) [phone] after 5 PM, weekends and holidays  09/16/2016 10:13 AM

## 2016-09-17 LAB — CBC
HCT: 25.9 % — ABNORMAL LOW (ref 39.0–52.0)
Hemoglobin: 8.2 g/dL — ABNORMAL LOW (ref 13.0–17.0)
MCH: 18.8 pg — AB (ref 26.0–34.0)
MCHC: 31.7 g/dL (ref 30.0–36.0)
MCV: 59.3 fL — ABNORMAL LOW (ref 78.0–100.0)
PLATELETS: 95 10*3/uL — AB (ref 150–400)
RBC: 4.37 MIL/uL (ref 4.22–5.81)
RDW: 17.5 % — AB (ref 11.5–15.5)
WBC: 5.2 10*3/uL (ref 4.0–10.5)

## 2016-09-17 LAB — BASIC METABOLIC PANEL
Anion gap: 8 (ref 5–15)
BUN: 29 mg/dL — AB (ref 6–20)
CALCIUM: 8.6 mg/dL — AB (ref 8.9–10.3)
CO2: 18 mmol/L — ABNORMAL LOW (ref 22–32)
CREATININE: 1.64 mg/dL — AB (ref 0.61–1.24)
Chloride: 110 mmol/L (ref 101–111)
GFR calc non Af Amer: 43 mL/min — ABNORMAL LOW (ref >60)
GFR, EST AFRICAN AMERICAN: 50 mL/min — AB (ref >60)
Glucose, Bld: 302 mg/dL — ABNORMAL HIGH (ref 65–99)
Potassium: 3.8 mmol/L (ref 3.5–5.1)
SODIUM: 136 mmol/L (ref 135–145)

## 2016-09-17 LAB — GLUCOSE, CAPILLARY
GLUCOSE-CAPILLARY: 142 mg/dL — AB (ref 65–99)
GLUCOSE-CAPILLARY: 168 mg/dL — AB (ref 65–99)
GLUCOSE-CAPILLARY: 194 mg/dL — AB (ref 65–99)
GLUCOSE-CAPILLARY: 220 mg/dL — AB (ref 65–99)
GLUCOSE-CAPILLARY: 233 mg/dL — AB (ref 65–99)
GLUCOSE-CAPILLARY: 309 mg/dL — AB (ref 65–99)

## 2016-09-17 MED ORDER — LIVING WELL WITH DIABETES BOOK
Freq: Once | Status: AC
  Administered 2016-09-17: 19:00:00
  Filled 2016-09-17: qty 1

## 2016-09-17 MED ORDER — INSULIN STARTER KIT- PEN NEEDLES (ENGLISH)
1.0000 | Freq: Once | Status: AC
  Administered 2016-09-17: 1
  Filled 2016-09-17: qty 1

## 2016-09-17 NOTE — Progress Notes (Signed)
Inpatient Diabetes Program Recommendations  AACE/ADA: New Consensus Statement on Inpatient Glycemic Control (2015)  Target Ranges:  Prepandial:   less than 140 mg/dL      Peak postprandial:   less than 180 mg/dL (1-2 hours)      Critically ill patients:  140 - 180 mg/dL   Lab Results  Component Value Date   GLUCAP 309 (H) 09/17/2016   HGBA1C 13.5 (H) 09/15/2016    Review of Glycemic Control  Results for Gordon Young, Gordon Young (MRN [id number]) as of 09/17/2016 14:06  Ref. Range 09/16/2016 20:17 09/16/2016 23:58 09/17/2016 04:12 09/17/2016 07:42 09/17/2016 11:53  Glucose-Capillary Latest Ref Range: 65 - 99 mg/dL 79 141 (H) 168 (H) 194 (H) 309 (H)   Inpatient Diabetes Program Recommendations:   Spoke with pt about A1C 13.5 results and explained what an A1C is, basic pathophysiology of DM Type 2, basic home care, basic diabetes diet nutrition principles, importance of checking CBGs and maintaining good CBG control to prevent long-term and short-term complications. Reviewed signs and symptoms of hyperglycemia and hypoglycemia and how to treat hypoglycemia at home. Also reviewed blood sugar goals at home.  RNs to provide ongoing basic DM education at bedside with this patient. Have ordered educational booklet, insulin starter kit, and DM videos along with case mgt consult regarding what insulin is covered for pt. Patient states he was on Levemir 15 units daily & Novolog 8-15 units tid with meals @ home but not taking regularly. Please consider: -Add Levemir 9 units (0.15 units/kg) -Decrease Novolog correction to 0-9 tid Will follow. Thank you, Nani Gasser. Catlynn Grondahl, RN, MSN, CDE Inpatient Glycemic Control Team Team Pager [phone] (8am-5pm) 09/17/2016 2:17 PM
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
## 2016-09-18 DIAGNOSIS — E1310 Other specified diabetes mellitus with ketoacidosis without coma: Secondary | ICD-10-CM

## 2016-09-18 LAB — GLUCOSE, CAPILLARY
Glucose-Capillary: 168 mg/dL — ABNORMAL HIGH (ref 65–99)
Glucose-Capillary: 178 mg/dL — ABNORMAL HIGH (ref 65–99)
Glucose-Capillary: 208 mg/dL — ABNORMAL HIGH (ref 65–99)

## 2016-09-18 LAB — BASIC METABOLIC PANEL
Anion gap: 5 (ref 5–15)
BUN: 15 mg/dL (ref 6–20)
CALCIUM: 6.7 mg/dL — AB (ref 8.9–10.3)
CO2: 18 mmol/L — AB (ref 22–32)
Chloride: 101 mmol/L (ref 101–111)
Creatinine, Ser: 1.08 mg/dL (ref 0.61–1.24)
GFR calc non Af Amer: >60 mL/min (ref >60)
GLUCOSE: 141 mg/dL — AB (ref 65–99)
Potassium: 2.6 mmol/L — CL (ref 3.5–5.1)
Sodium: 124 mmol/L — ABNORMAL LOW (ref 135–145)

## 2016-09-18 LAB — CBC
HEMATOCRIT: 23.6 % — AB (ref 39.0–52.0)
Hemoglobin: 7.4 g/dL — ABNORMAL LOW (ref 13.0–17.0)
MCH: 18.5 pg — ABNORMAL LOW (ref 26.0–34.0)
MCHC: 31.4 g/dL (ref 30.0–36.0)
MCV: 58.9 fL — AB (ref 78.0–100.0)
PLATELETS: 80 10*3/uL — AB (ref 150–400)
RBC: 4.01 MIL/uL — ABNORMAL LOW (ref 4.22–5.81)
RDW: 17 % — AB (ref 11.5–15.5)
WBC: 5.5 10*3/uL (ref 4.0–10.5)

## 2016-09-18 MED ORDER — POTASSIUM CHLORIDE CRYS ER 20 MEQ PO TBCR
40.0000 meq | EXTENDED_RELEASE_TABLET | Freq: Once | ORAL | Status: AC
  Administered 2016-09-18: 40 meq via ORAL
  Filled 2016-09-18: qty 2
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
## 2016-09-18 NOTE — Progress Notes (Signed)
Notified MD that pt is wanting to leave because he feels we are holding him against his will. Awaiting orders
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
## 2016-09-26 NOTE — Discharge Summary (Signed)
Physician Discharge Summary  Gordon Young [id number] DOB: 15-Dec-1950 DOA: 09/15/2016  PCP: Minerva Ends, MD  Admit date: 09/15/2016 Discharge date: 09/26/2016  Recommendations for Outpatient Follow-up:  1. Pt left AMA  Discharge Diagnoses:  Principal Problem:   DKA (diabetic ketoacidoses) ([place]) Active Problems:   Diabetes mellitus, type II, insulin dependent ([facility])   Microcytic anemia   HYPERTENSION, BENIGN ESSENTIAL   Current non-adherence to medical treatment   CKD (chronic kidney disease), stage III   Acute kidney injury ([place])   Hyperkalemia   Refusal of blood transfusions as patient is Jehovah's Witness   Paranoid delusion ([place])   Acute renal failure (ARF) ([place])   HHNC (hyperglycemic hyperosmolar nonketotic coma) ([place])    Discharge Condition: pt left AMA  Diet recommendation: as tolerated   History of present illness:  66 y.o.malewith medical history significant forinsulin-dependent diabetes mellitus, hypertension, chronic kidney disease stage III with baseline creatinine 1.48 in June 2016. Patient presented to ED with lethargy and vomiting. Patient is noncompliant with his medications including insulin. He was brought to ED by his son. Of note, patient's family on admission reported that the patient used to live with his son and daughter-in-law but stopped living with them in November because he was concerned that "they were going through his stuff". Patient apparently had multiple pad locks on his closet but still had thought that his son and daughter-in-law who are taking his tonsils. He has been also spending money on sweepstakes and apparently brought to new iPhone's today prior to this admission even though he is unable to afford this. Patient is Jehovah's Witness.  On admission, blood pressure was 96/69 but with IV fluids it has improved to 149/63. Patient was afebrile, heart rate was 132. Oxygen saturation was 98% on room air. Blood work was notable for white  blood cell count of 10.6, hemoglobin 9.7. The 12-lead EKG showed sinus rhythm with peaked T waves. Potassium was 6.8 and then 7.2. He was given calcium gluconate, Kayexalate, bicarbonate, albuterol and he was started on insulin drip.  He had coffee ground emesis and GI has seen him in consultation.   Hospital Course:    Assessment & Plan:   Principal Problem: Hyperosmolar hyperglycemic nonketotic coma / uncontrolled diabetes mellitus with diabetic nephropathy with long-term insulin use / High AG metabolic acidosis  - Glucose of 656 on admission, glucose was more than 1000 on urinalysis but there were no ketones. AG was 12 and CO2 17 - Now transitioned to subQ insulin for past 24 hours - Added novolog 3 units TID on 09/16/2016  Active Problems: Coffee-ground emesis - Has resolved since - He did not need NG tube - Since hematemesis improved GI advance the diet and no diagnostic intervention planned at this time - Continue protonix 40 mg IV Q 12 hours   Acute renal failure superimposed on chronic kidney disease stage III - Baseline creatinine in June 2016 was 1.48 and creatinine on this admission 3.33 - Creatinine steadily improved with IV fluids  Hyperkalemia - Likely due to hyperglycemic hyperosmolar nonketotic coma, acute renal failure superimposed on chronic kidney disease - As noted above, treated hyperkalemia with calcium gluconate, insulin sodium bicarbonate, albuterol and Kayexalate - Repeat potassium level WNL  Hypokalemia - Potassium 2.6 and supplemented - Wanted to check BMP in am but pt left AMA  Hypernatremia - From hyperglycemia - Normalized with correction of hyperglycemia and hydration   DVT prophylaxis: SCD's bilaterally  Code Status: full code  Family Communication: No  family at the bedside    Consultants:   GI  Procedures:   NG to be placed 1/6  Antimicrobials:   None     Signed:  Leisa Lenz, MD  [facility] 09/26/2016, 5:38 PM  Pager #: [phone]  Time spent in minutes: less than 30 minutes  Discharge Exam: Vitals:   09/18/16 0411 09/18/16 0900  BP: (!) 169/93 (!) 155/88  Pulse: (!) 103 100  Resp: 18 18  Temp: 98.6 F (37 C) 98 F (36.7 C)   Vitals:   09/17/16 1955 09/17/16 2115 09/18/16 0411 09/18/16 0900  BP: (!) 158/91  (!) 169/93 (!) 155/88  Pulse: (!) 101  (!) 103 100  Resp: '18  18 18  '$ Temp: 98.9 F (37.2 C)  98.6 F (37 C) 98 F (36.7 C)  TempSrc: Oral  Oral Oral  SpO2: 100%  100% 100%  Weight:  83.4 kg (183 lb 13.8 oz)    Height:        General: Pt is alert, follows commands appropriately, not in acute distress Cardiovascular: Regular rate and rhythm, S1/S2 + Respiratory: Clear to auscultation bilaterally, no wheezing, no crackles, no rhonchi Abdominal: Soft, non tender, non distended, bowel sounds +, no guarding Extremities: no edema, no cyanosis, pulses palpable bilaterally DP and PT Neuro: Grossly nonfocal  Discharge Instructions   Allergies as of 09/18/2016   No Known Allergies     Medication List    ASK your doctor about these medications   ACCU-CHEK AVIVA PLUS w/Device Kit 1 Device by Does not apply route 3 (three) times daily after meals. B11.65   accu-chek softclix lancets 1 each by Other route 3 (three) times daily.   ACCU-CHEK SOFTCLIX LANCETS lancets 1 each by Other route 3 (three) times daily.   ferrous sulfate 325 (65 FE) MG tablet Take 1 tablet (325 mg total) by mouth 2 (two) times daily with a meal.   glucose blood test strip Commonly known as:  ACCU-CHEK AVIVA PLUS 1 each by Other route 3 (three) times daily.   insulin aspart 100 UNIT/ML FlexPen Commonly known as:  NOVOLOG FLEXPEN Inject 8 Units into the skin 3 (three) times daily with meals.   insulin glargine 100 UNIT/ML injection Commonly known as:  LANTUS Inject 0.24 mLs (24 Units total) into the skin at bedtime.   LEVEMIR FLEXTOUCH 100 UNIT/ML Pen Generic  drug:  Insulin Detemir Inject 14 Units into the skin at bedtime.   lisinopril 20 MG tablet Commonly known as:  PRINIVIL,ZESTRIL Take 20 mg by mouth daily.   lisinopril 40 MG tablet Commonly known as:  PRINIVIL,ZESTRIL Take 1 tablet (40 mg total) by mouth daily.   NEEDLE (DISP) 30 G 30G X 1" Misc Commonly known as:  B-D DISP NEEDLE 30GX1" 1 each by Does not apply route 3 (three) times daily.         The results of significant diagnostics from this hospitalization (including imaging, microbiology, ancillary and laboratory) are listed below for reference.    Significant Diagnostic Studies: Dg Chest 1 View  Result Date: 09/15/2016 CLINICAL DATA:  DKA, acute renal failure EXAM: CHEST 1 VIEW COMPARISON:  12/19/2014 FINDINGS: Cardiac shadow is within normal limits. The lungs are well aerated bilaterally. No focal infiltrate or sizable effusion is seen. No acute bony abnormality is noted. IMPRESSION: No active disease. Electronically Signed   By: Inez Catalina M.D.   On: 09/15/2016 07:11   US Renal  Result Date: 09/15/2016 CLINICAL DATA:  Acute renal failure  EXAM: RENAL / URINARY TRACT ULTRASOUND COMPLETE COMPARISON:  12/19/2014 FINDINGS: Right Kidney: Length: 9.4 cm. Echogenicity within normal limits. No mass or hydronephrosis visualized. Left Kidney: Length: 9.8 cm. Echogenicity within normal limits. No mass or hydronephrosis visualized. Bladder: Appears normal for degree of bladder distention. Incidental note is made of multiple gallstones. IMPRESSION: Cholelithiasis. No acute abnormality is noted in the kidneys. Electronically Signed   By: Inez Catalina M.D.   On: 09/15/2016 07:11    Microbiology: No results found for this or any previous visit (from the past 240 hour(s)).   Labs: Basic Metabolic Panel: No results for input(s): NA, K, CL, CO2, GLUCOSE, BUN, CREATININE, CALCIUM, MG, PHOS in the last 168 hours. Liver Function Tests: No results for input(s): AST, ALT, ALKPHOS, BILITOT,  PROT, ALBUMIN in the last 168 hours. No results for input(s): LIPASE, AMYLASE in the last 168 hours. No results for input(s): AMMONIA in the last 168 hours. CBC: No results for input(s): WBC, NEUTROABS, HGB, HCT, MCV, PLT in the last 168 hours. Cardiac Enzymes: No results for input(s): CKTOTAL, CKMB, CKMBINDEX, TROPONINI in the last 168 hours. BNP: BNP (last 3 results)  Recent Labs  09/15/16 1440  BNP 59.1    ProBNP (last 3 results) No results for input(s): PROBNP in the last 8760 hours.  CBG: No results for input(s): GLUCAP in the last 168 hours.

## 2016-11-08 DEATH — deceased

## 2017-01-23 ENCOUNTER — Encounter: Payer: Self-pay | Admitting: Family Medicine

## 2017-07-20 IMAGING — DX DG CHEST 1V
1 series · 1 of 1 positions shown · non-contrast
Comparison: 12/19/2014

CLINICAL DATA: DKA, acute renal failure

EXAM:
CHEST 1 VIEW

[chest pa]
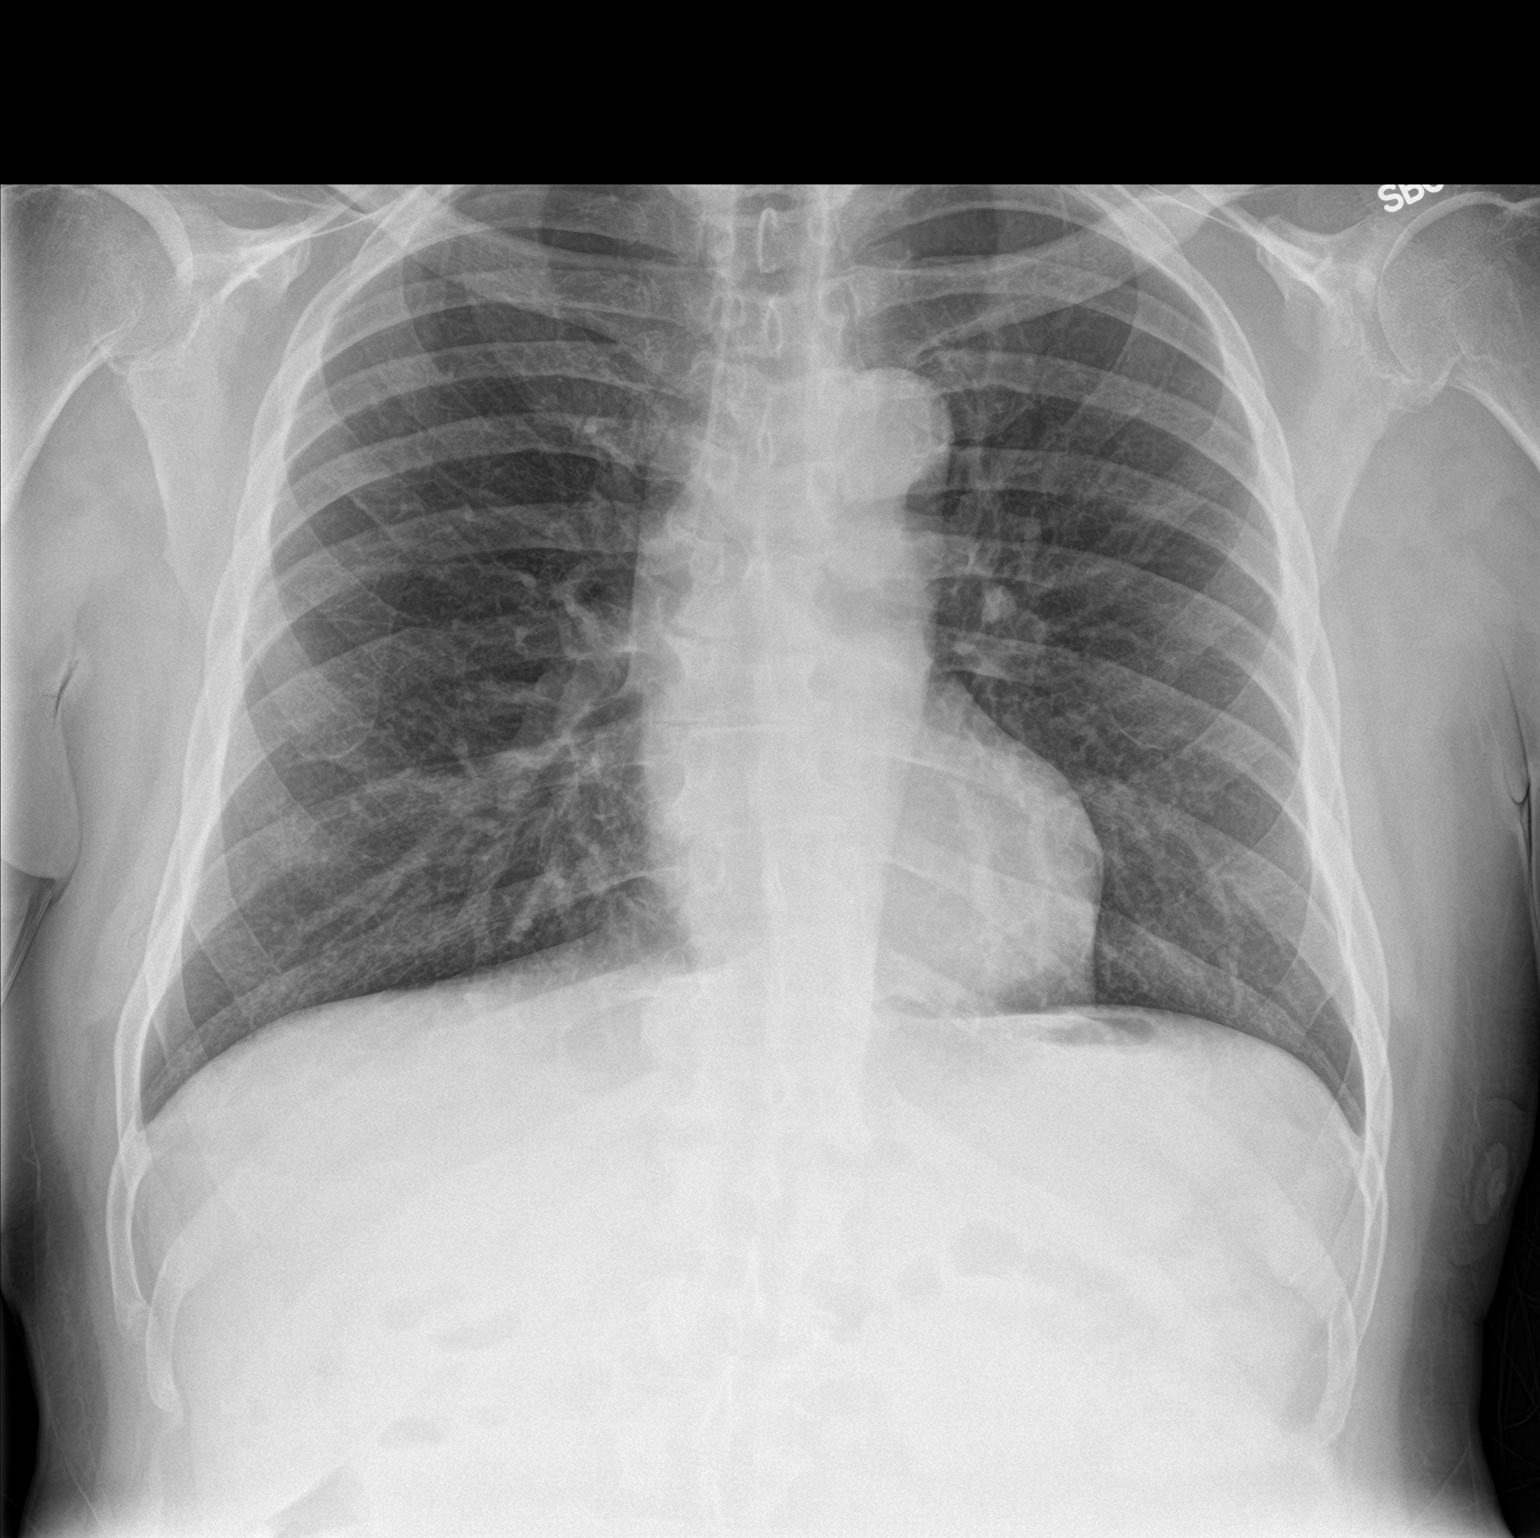

[1 of 1 positions shown; findings below may reference images not displayed]

FINDINGS: Cardiac shadow is within normal limits. The lungs are well aerated
bilaterally. No focal infiltrate or sizable effusion is seen. No
acute bony abnormality is noted.
IMPRESSION: No active disease.

## 2018-03-24 IMAGING — US US RENAL
1 series · 14 of 25 positions shown · non-contrast
Comparison: 12/19/2014

CLINICAL DATA: Acute renal failure

EXAM:
RENAL / URINARY TRACT ULTRASOUND COMPLETE

[Series 1: us renal · 0.23mm/px · 14 of 34 slices shown]
[im 1/34]
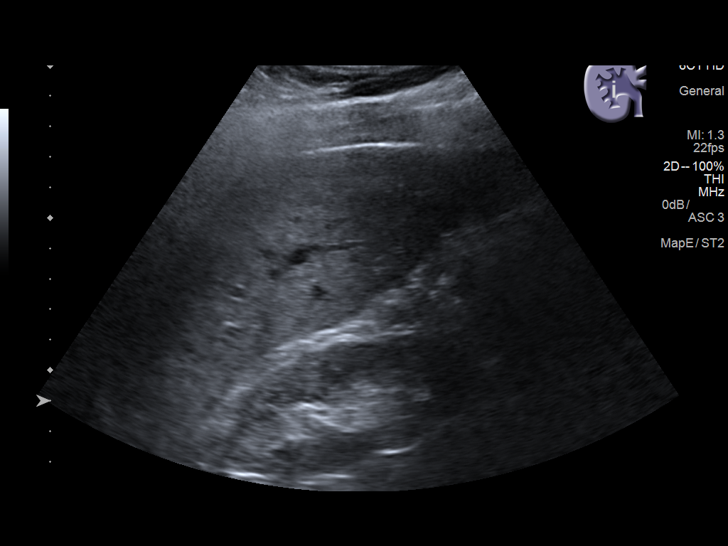
[im 3/34]
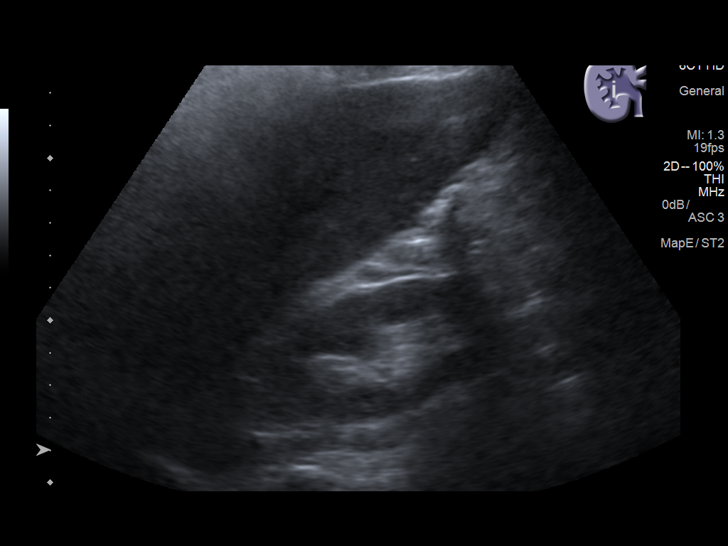
[im 6/34]
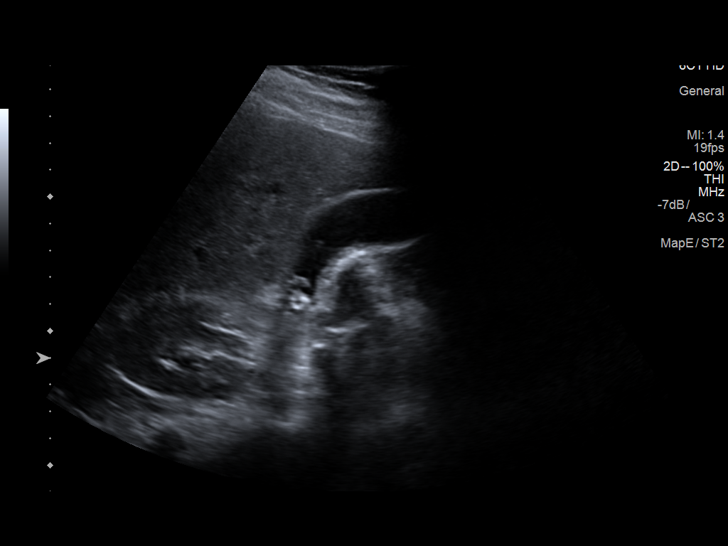
[im 9/34]
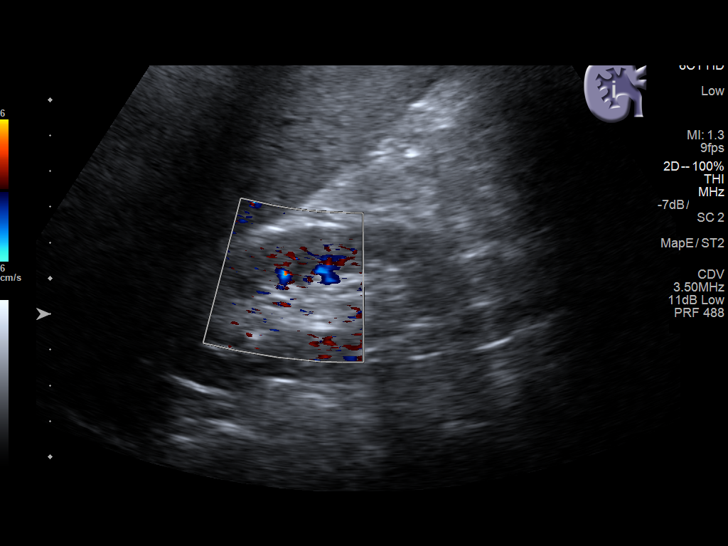
[im 12/34]
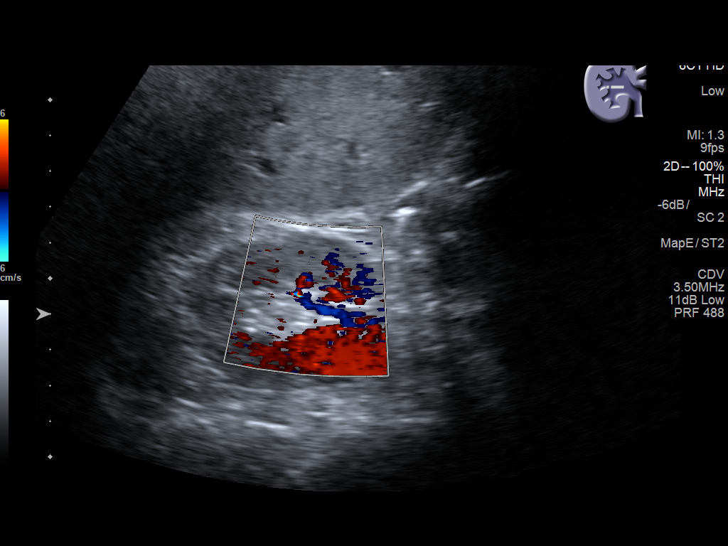
[im 13/34]
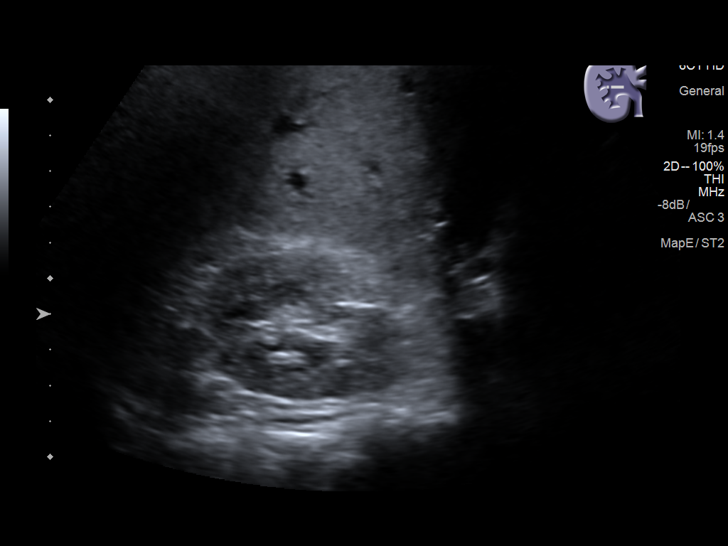
[im 16/34]
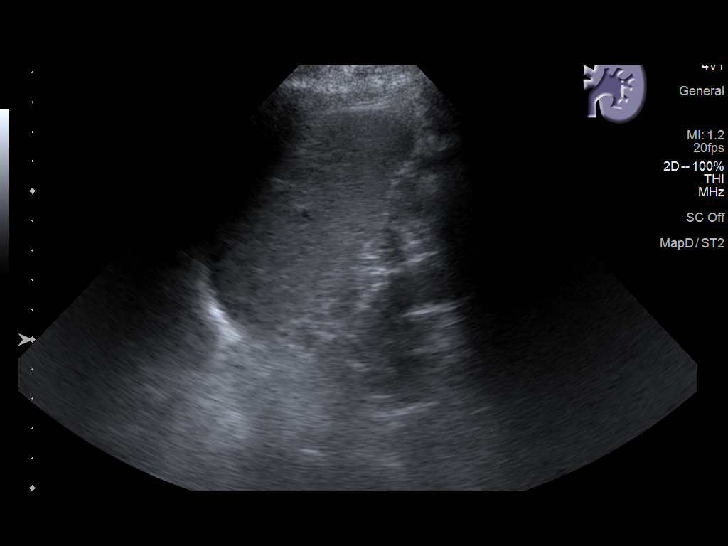
[im 18/34]
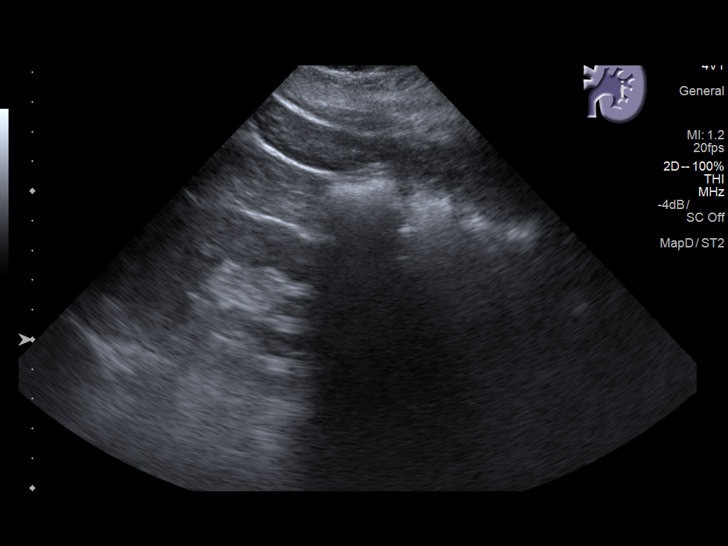
[im 21/34]
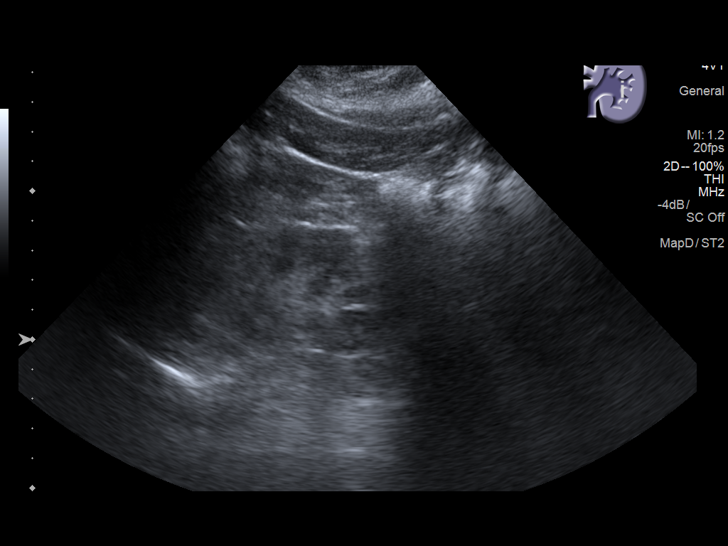
[im 23/34]
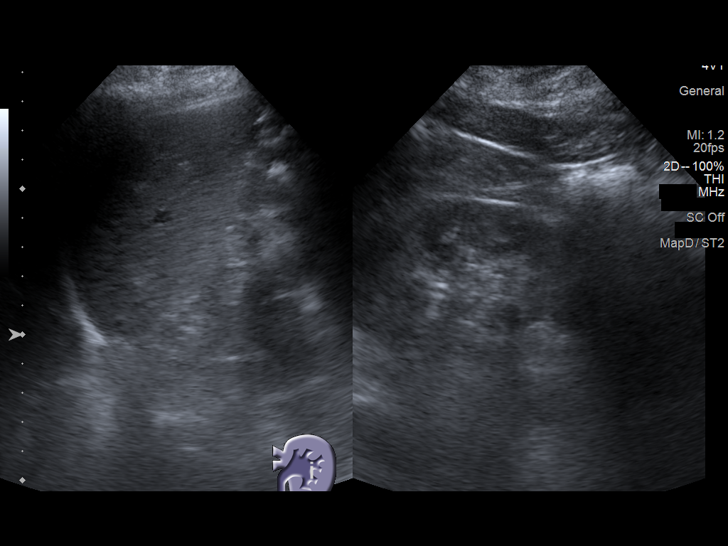
[im 25/34]
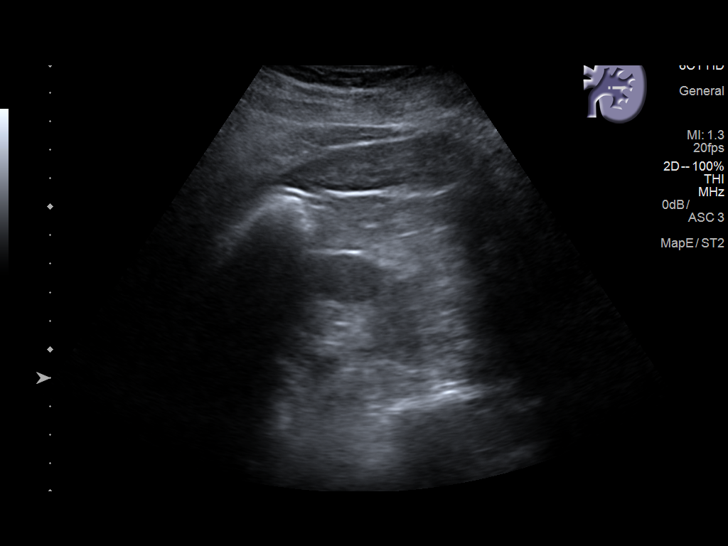
[im 28/34]
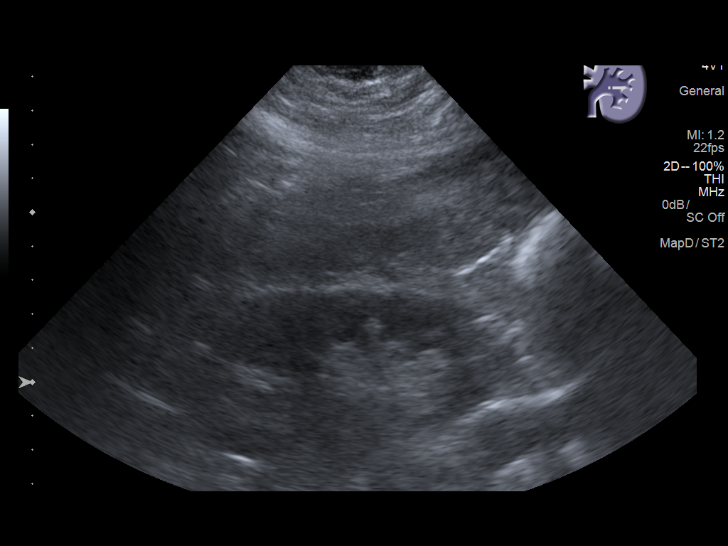
[im 31/34]
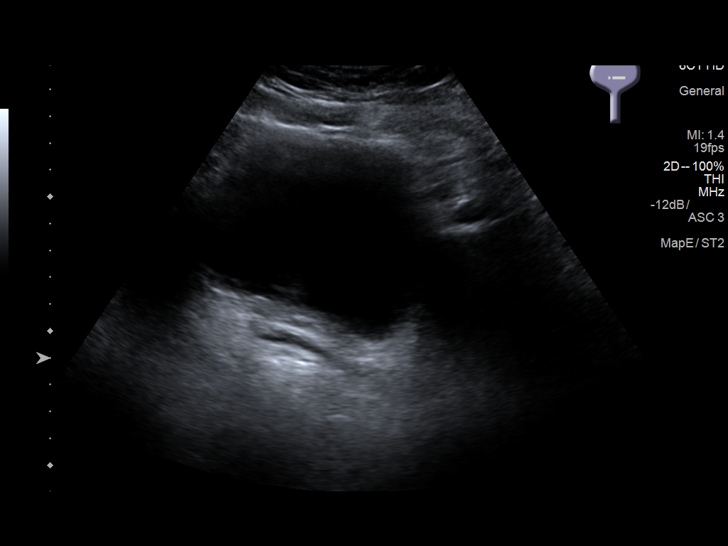
[im 34/34]
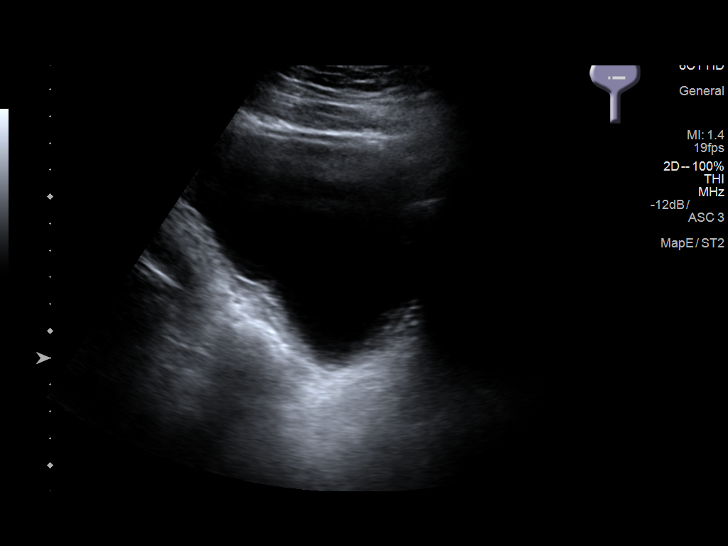

[14 of 25 positions shown; findings below may reference images not displayed]

FINDINGS: Right Kidney:

Length: 9.4 cm.. Echogenicity within normal limits. No mass or
hydronephrosis visualized.

Left Kidney:

Length: 9.8 cm.. Echogenicity within normal limits. No mass or
hydronephrosis visualized.

Bladder:

Appears normal for degree of bladder distention.

Incidental note is made of multiple gallstones.
IMPRESSION: Cholelithiasis.

No acute abnormality is noted in the kidneys.
